# Patient Record
Sex: Male | Born: 1960 | Race: Black or African American | Hispanic: No | Marital: Married | Smoking: Never smoker
Health system: Southern US, Community
[De-identification: ages and names within clinical notes are randomized; demographics above are authoritative.]

## PROBLEM LIST (undated history)

## (undated) DIAGNOSIS — I2510 Atherosclerotic heart disease of native coronary artery without angina pectoris: Secondary | ICD-10-CM

## (undated) DIAGNOSIS — I10 Essential (primary) hypertension: Secondary | ICD-10-CM

## (undated) DIAGNOSIS — E119 Type 2 diabetes mellitus without complications: Secondary | ICD-10-CM

## (undated) DIAGNOSIS — I82890 Acute embolism and thrombosis of other specified veins: Secondary | ICD-10-CM

## (undated) DIAGNOSIS — K861 Other chronic pancreatitis: Secondary | ICD-10-CM

## (undated) DIAGNOSIS — M1990 Unspecified osteoarthritis, unspecified site: Secondary | ICD-10-CM

## (undated) HISTORY — PX: CERVICAL DISC SURGERY: SHX588

## (undated) HISTORY — DX: Acute embolism and thrombosis of other specified veins: I82.890

## (undated) HISTORY — PX: OTHER SURGICAL HISTORY: SHX169

## (undated) HISTORY — DX: Other chronic pancreatitis: K86.1

---
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
## 1997-10-22 ENCOUNTER — Encounter: Payer: Self-pay | Admitting: Emergency Medicine

## 1997-10-22 ENCOUNTER — Emergency Department (HOSPITAL_COMMUNITY): Admission: EM | Admit: 1997-10-22 | Discharge: 1997-10-22 | Payer: Self-pay | Admitting: Emergency Medicine

## 2002-11-11 ENCOUNTER — Emergency Department (HOSPITAL_COMMUNITY): Admission: EM | Admit: 2002-11-11 | Discharge: 2002-11-11 | Payer: Self-pay | Admitting: Emergency Medicine

## 2003-04-02 ENCOUNTER — Inpatient Hospital Stay (HOSPITAL_COMMUNITY): Admission: EM | Admit: 2003-04-02 | Discharge: 2003-04-05 | Payer: Self-pay | Admitting: Family Medicine

## 2003-04-05 ENCOUNTER — Encounter: Admission: RE | Admit: 2003-04-05 | Discharge: 2003-04-05 | Payer: Self-pay | Admitting: Family Medicine

## 2003-12-28 ENCOUNTER — Ambulatory Visit: Payer: Self-pay | Admitting: Internal Medicine

## 2004-01-17 ENCOUNTER — Ambulatory Visit: Payer: Self-pay | Admitting: Internal Medicine

## 2004-01-18 ENCOUNTER — Emergency Department (HOSPITAL_COMMUNITY): Admission: EM | Admit: 2004-01-18 | Discharge: 2004-01-18 | Payer: Self-pay | Admitting: Emergency Medicine

## 2004-03-14 ENCOUNTER — Ambulatory Visit: Payer: Self-pay | Admitting: Internal Medicine

## 2004-08-19 ENCOUNTER — Ambulatory Visit: Payer: Self-pay | Admitting: Internal Medicine

## 2005-06-18 ENCOUNTER — Encounter: Payer: Self-pay | Admitting: Emergency Medicine

## 2005-11-20 ENCOUNTER — Ambulatory Visit: Payer: Self-pay | Admitting: Internal Medicine

## 2005-12-22 ENCOUNTER — Ambulatory Visit: Payer: Self-pay | Admitting: Internal Medicine

## 2006-04-28 ENCOUNTER — Ambulatory Visit: Payer: Self-pay | Admitting: Internal Medicine

## 2006-05-18 ENCOUNTER — Ambulatory Visit: Payer: Self-pay | Admitting: Internal Medicine

## 2008-06-10 ENCOUNTER — Emergency Department (HOSPITAL_COMMUNITY): Admission: EM | Admit: 2008-06-10 | Discharge: 2008-06-10 | Payer: Self-pay | Admitting: Emergency Medicine

## 2008-10-02 ENCOUNTER — Encounter: Admission: RE | Admit: 2008-10-02 | Discharge: 2008-10-02 | Payer: Self-pay | Admitting: Family Medicine

## 2009-06-30 ENCOUNTER — Ambulatory Visit (HOSPITAL_COMMUNITY): Admission: RE | Admit: 2009-06-30 | Discharge: 2009-06-30 | Payer: Self-pay | Admitting: Psychiatry

## 2009-12-13 ENCOUNTER — Inpatient Hospital Stay (HOSPITAL_COMMUNITY): Admission: EM | Admit: 2009-12-13 | Discharge: 2009-12-14 | Payer: Self-pay | Admitting: Emergency Medicine

## 2009-12-13 ENCOUNTER — Emergency Department (HOSPITAL_COMMUNITY): Admission: EM | Admit: 2009-12-13 | Discharge: 2009-12-13 | Payer: Self-pay | Admitting: Family Medicine

## 2010-05-01 LAB — RAPID URINE DRUG SCREEN, HOSP PERFORMED
Barbiturates: NOT DETECTED
Opiates: POSITIVE — AB

## 2010-05-01 LAB — URINALYSIS, ROUTINE W REFLEX MICROSCOPIC
Nitrite: NEGATIVE
Protein, ur: NEGATIVE mg/dL
Specific Gravity, Urine: 1.006 (ref 1.005–1.030)
Urobilinogen, UA: 0.2 mg/dL (ref 0.0–1.0)

## 2010-05-01 LAB — COMPREHENSIVE METABOLIC PANEL
ALT: 27 U/L (ref 0–53)
ALT: 27 U/L (ref 0–53)
AST: 27 U/L (ref 0–37)
Albumin: 3.8 g/dL (ref 3.5–5.2)
Alkaline Phosphatase: 49 U/L (ref 39–117)
CO2: 20 mEq/L (ref 19–32)
CO2: 22 mEq/L (ref 19–32)
Calcium: 8.2 mg/dL — ABNORMAL LOW (ref 8.4–10.5)
Calcium: 8.4 mg/dL (ref 8.4–10.5)
Chloride: 103 mEq/L (ref 96–112)
Creatinine, Ser: 1.07 mg/dL (ref 0.4–1.5)
GFR calc Af Amer: >60 mL/min (ref >60)
GFR calc non Af Amer: >60 mL/min (ref >60)
GFR calc non Af Amer: >60 mL/min (ref >60)
Glucose, Bld: 97 mg/dL (ref 70–99)
Sodium: 131 mEq/L — ABNORMAL LOW (ref 135–145)
Sodium: 140 mEq/L (ref 135–145)
Total Bilirubin: 0.6 mg/dL (ref 0.3–1.2)

## 2010-05-01 LAB — CBC
Hemoglobin: 12.7 g/dL — ABNORMAL LOW (ref 13.0–17.0)
Hemoglobin: 13 g/dL (ref 13.0–17.0)
MCH: 31 pg (ref 26.0–34.0)
MCHC: 34.6 g/dL (ref 30.0–36.0)
MCHC: 34.6 g/dL (ref 30.0–36.0)
Platelets: 174 10*3/uL (ref 150–400)
RBC: 4.12 MIL/uL — ABNORMAL LOW (ref 4.22–5.81)

## 2010-05-01 LAB — DIFFERENTIAL
Basophils Absolute: 0 10*3/uL (ref 0.0–0.1)
Basophils Relative: 1 % (ref 0–1)
Eosinophils Absolute: 0.1 10*3/uL (ref 0.0–0.7)
Eosinophils Absolute: 0.1 10*3/uL (ref 0.0–0.7)
Eosinophils Relative: 2 % (ref 0–5)
Lymphocytes Relative: 35 % (ref 12–46)
Lymphs Abs: 1.6 10*3/uL (ref 0.7–4.0)
Lymphs Abs: 1.8 10*3/uL (ref 0.7–4.0)
Monocytes Absolute: 0.5 10*3/uL (ref 0.1–1.0)
Monocytes Relative: 10 % (ref 3–12)
Neutrophils Relative %: 55 % (ref 43–77)

## 2010-05-01 LAB — D-DIMER, QUANTITATIVE: D-Dimer, Quant: <0.22 ug/mL-FEU (ref 0.00–0.48)

## 2010-05-01 LAB — CARDIAC PANEL(CRET KIN+CKTOT+MB+TROPI)
CK, MB: 1.1 ng/mL (ref 0.3–4.0)
CK, MB: 1.3 ng/mL (ref 0.3–4.0)
Relative Index: 0.5 (ref 0.0–2.5)
Relative Index: 0.6 (ref 0.0–2.5)
Total CK: 212 U/L (ref 7–232)

## 2010-05-01 LAB — POCT CARDIAC MARKERS
CKMB, poc: <1 ng/mL — ABNORMAL LOW (ref 1.0–8.0)
Troponin i, poc: <0.05 ng/mL (ref 0.00–0.09)

## 2010-05-01 LAB — MAGNESIUM: Magnesium: 2 mg/dL (ref 1.5–2.5)

## 2010-05-01 LAB — CK TOTAL AND CKMB (NOT AT ARMC): Total CK: 241 U/L — ABNORMAL HIGH (ref 7–232)

## 2010-05-01 LAB — LIPASE, BLOOD: Lipase: 49 U/L (ref 11–59)

## 2010-05-29 LAB — DIFFERENTIAL
Eosinophils Absolute: 0 10*3/uL (ref 0.0–0.7)
Eosinophils Relative: 1 % (ref 0–5)
Lymphocytes Relative: 15 % (ref 12–46)
Lymphs Abs: 0.9 10*3/uL (ref 0.7–4.0)
Monocytes Relative: 7 % (ref 3–12)

## 2010-05-29 LAB — CBC
HCT: 41.5 % (ref 39.0–52.0)
Hemoglobin: 14.4 g/dL (ref 13.0–17.0)
MCV: 93.6 fL (ref 78.0–100.0)
Platelets: 276 10*3/uL (ref 150–400)
RBC: 4.43 MIL/uL (ref 4.22–5.81)
WBC: 6.4 10*3/uL (ref 4.0–10.5)

## 2010-05-29 LAB — BASIC METABOLIC PANEL
BUN: 9 mg/dL (ref 6–23)
Chloride: 106 mEq/L (ref 96–112)
GFR calc Af Amer: >60 mL/min (ref >60)
GFR calc non Af Amer: >60 mL/min (ref >60)
Potassium: 3.9 mEq/L (ref 3.5–5.1)
Sodium: 136 mEq/L (ref 135–145)

## 2010-05-29 LAB — SEDIMENTATION RATE: Sed Rate: 40 mm/hr — ABNORMAL HIGH (ref 0–16)

## 2010-05-29 LAB — POCT CARDIAC MARKERS
CKMB, poc: 1.5 ng/mL (ref 1.0–8.0)
Troponin i, poc: <0.05 ng/mL (ref 0.00–0.09)

## 2010-06-27 ENCOUNTER — Encounter (HOSPITAL_COMMUNITY): Payer: Self-pay | Admitting: Radiology

## 2010-06-27 ENCOUNTER — Emergency Department (HOSPITAL_COMMUNITY): Payer: BC Managed Care – PPO

## 2010-06-27 ENCOUNTER — Emergency Department (HOSPITAL_COMMUNITY)
Admission: EM | Admit: 2010-06-27 | Discharge: 2010-06-27 | Disposition: A | Discharge status: Home / Self Care | Payer: BC Managed Care – PPO | Attending: Emergency Medicine | Admitting: Emergency Medicine

## 2010-06-27 DIAGNOSIS — Z8639 Personal history of other endocrine, nutritional and metabolic disease: Secondary | ICD-10-CM | POA: Insufficient documentation

## 2010-06-27 DIAGNOSIS — R059 Cough, unspecified: Secondary | ICD-10-CM | POA: Insufficient documentation

## 2010-06-27 DIAGNOSIS — R071 Chest pain on breathing: Secondary | ICD-10-CM | POA: Insufficient documentation

## 2010-06-27 DIAGNOSIS — Z862 Personal history of diseases of the blood and blood-forming organs and certain disorders involving the immune mechanism: Secondary | ICD-10-CM | POA: Insufficient documentation

## 2010-06-27 DIAGNOSIS — R079 Chest pain, unspecified: Secondary | ICD-10-CM | POA: Insufficient documentation

## 2010-06-27 DIAGNOSIS — R05 Cough: Secondary | ICD-10-CM | POA: Insufficient documentation

## 2010-06-27 DIAGNOSIS — I10 Essential (primary) hypertension: Secondary | ICD-10-CM | POA: Insufficient documentation

## 2010-06-27 HISTORY — DX: Essential (primary) hypertension: I10

## 2010-06-27 LAB — BASIC METABOLIC PANEL
CO2: 21 mEq/L (ref 19–32)
Chloride: 100 mEq/L (ref 96–112)
GFR calc Af Amer: >60 mL/min (ref >60)
Glucose, Bld: 95 mg/dL (ref 70–99)
Sodium: 134 mEq/L — ABNORMAL LOW (ref 135–145)

## 2010-06-27 LAB — DIFFERENTIAL
Basophils Absolute: 0 10*3/uL (ref 0.0–0.1)
Basophils Relative: 0 % (ref 0–1)
Lymphocytes Relative: 21 % (ref 12–46)
Monocytes Absolute: 0.5 10*3/uL (ref 0.1–1.0)
Monocytes Relative: 10 % (ref 3–12)
Neutro Abs: 3.5 10*3/uL (ref 1.7–7.7)
Neutrophils Relative %: 66 % (ref 43–77)

## 2010-06-27 LAB — CBC
HCT: 36.3 % — ABNORMAL LOW (ref 39.0–52.0)
Hemoglobin: 12.2 g/dL — ABNORMAL LOW (ref 13.0–17.0)
MCH: 30.1 pg (ref 26.0–34.0)
RBC: 4.05 MIL/uL — ABNORMAL LOW (ref 4.22–5.81)

## 2010-06-27 LAB — POCT CARDIAC MARKERS: CKMB, poc: 1.2 ng/mL (ref 1.0–8.0)

## 2010-06-27 MED ORDER — IOHEXOL 350 MG/ML SOLN
100.0000 mL | Freq: Once | INTRAVENOUS | Status: AC | PRN
  Administered 2010-06-27: 100 mL via INTRAVENOUS

## 2010-07-05 NOTE — Discharge Summary (Signed)
NAME:  Hunter Ray, Hunter Ray                    ACCOUNT NO.:  [id number]   MEDICAL RECORD NO.:  [id number]                   PATIENT TYPE:  INP   LOCATION:  5022                                 FACILITY:  [facility]   PHYSICIAN:  Rene Paci, M.D. [facility]          DATE OF BIRTH:  15-Jun-1960   DATE OF ADMISSION:  04/02/2003  DATE OF DISCHARGE:  04/05/2003                                 DISCHARGE SUMMARY   DISCHARGE DIAGNOSES:  1. Right elbow cellulitis and bursitis.  2. Gout.   HISTORY OF PRESENT ILLNESS:  Mr. Debarr is a 50 year old African  American male who injured his right elbow at work on the Thursday prior to  admission.  He noted some swelling and pain that night.  He worked the whole  next day.  He noticed increased swelling and increased difficulty sleeping  secondary to pain.  He went to the urgent care and received an IM injection  of Rocephin and sent home.  He returned with increased swelling and had  developed a white count.  He was admitted for IV antibiotics.   PAST MEDICAL HISTORY:  1. Gout.  2. Hypertension.  3. Pancreatitis.   HOSPITAL COURSE:  INFECTIOUS DISEASE:  The patient presented with evidence  of right elevated cellulitis and bursitis.  The patient started on Unasyn.  We did ask for orthopedics to see the patient.  They recommended continuing  current treatment with additional use of high dose nonsteroidals and  treatment of gout.  They did not pursue an I&D or aspiration secondary to  potentially worsening the infection.  The patient's condition slowly  improved.  He was changed to oral antibiotics.  The patient was started on  colchicine as well.  The patient was felt to be stable for discharge with  outpatient follow-up and was discharged on April 05, 2003.   LABORATORY DATA AT DISCHARGE:  White count 8.1, hemoglobin 12.9.  LFTs were  normal.  Blood cultures were negative.   DISCHARGE MEDICATIONS:  1. Augmentin XR 1000 mg two tablets b.i.d.  for 10 days.  2. Toprol XL 100 mg daily.  3. Allopurinol 300 mg daily.  4. Colchicine p.r.n.  5. Indocin p.r.n.   FOLLOW UP:  The patient will follow up with Dr. Charlann Boxer in the next several  days and Dr. Lovell Sheehan as needed.      Cornell Barman, P.A. [facility]                  Rene Paci, M.D. [facility]    LC/MEDQ  D:  04/19/2003  T:  04/20/2003  Job:  [id number]   cc:   Stacie Glaze, M.D. [facility]   Molli Hazard D. Charlann Boxer, M.D.  [facility]  [address]  [address]  [place]  [place] [address]  Fax: [phone]

## 2010-12-05 ENCOUNTER — Ambulatory Visit: Payer: BC Managed Care – PPO | Admitting: Gastroenterology

## 2010-12-06 ENCOUNTER — Encounter: Payer: Self-pay | Admitting: Urgent Care

## 2010-12-06 ENCOUNTER — Ambulatory Visit (INDEPENDENT_AMBULATORY_CARE_PROVIDER_SITE_OTHER): Payer: BC Managed Care – PPO | Admitting: Urgent Care

## 2010-12-06 VITALS — BP 150/97 | HR 91 | Temp 98.6°F | Ht 74.0 in | Wt 222.4 lb

## 2010-12-06 DIAGNOSIS — K921 Melena: Secondary | ICD-10-CM

## 2010-12-06 DIAGNOSIS — I10 Essential (primary) hypertension: Secondary | ICD-10-CM | POA: Insufficient documentation
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
## 2010-12-06 NOTE — Assessment & Plan Note (Signed)
MIRZA FESSEL is a 50 y.o. male with recent large volume hematochezia.  He is going to need colonoscopy for further evaluation to rule out colorectal carcinoma, diverticular bleed, colon polyp, or ischemia.  Benign anorectal source remains in the differential as well.  I have discussed risks & benefits which include, but are not limited to, bleeding, infection, perforation & drug reaction.  The patient agrees with this plan & written consent will be obtained.

## 2010-12-06 NOTE — Patient Instructions (Addendum)
Your blood pressure is too high today.  Check it later today at [facility] or somewhere. Call Colette Ribas, MD if remains over 145/85. To ER if severe bleeding.  Keep colonoscopy  Rectal Bleeding Rectal bleeding is when blood passes out of the anus. It is usually a sign that something is wrong. It may not be serious, but it should always be evaluated. Rectal bleeding may present as bright red blood or extremely dark stools. The color may range from dark red or maroon to black (like tar). It is important that the cause of rectal bleeding be identified so treatment can be started and the problem corrected. CAUSES   Hemorrhoids. These are enlarged (dilated) blood vessels or veins in the anal or rectal area.   Fistulas. Theseare abnormal, burrowing channels that usually run from inside the rectum to the skin around the anus. They can bleed.   Anal fissures. This is a tear in the tissue of the anus. Bleeding occurs with bowel movements.   Diverticulosis. This is a condition in which pockets or sacs project from the bowel wall. Occasionally, the sacs can bleed.   Diverticulitis. Thisis an infection involving diverticulosis of the colon.   Proctitis and colitis. These are conditions in which the rectum, colon, or both, can become inflamed and pitted (ulcerated).   Polyps and cancer. Polyps are non-cancerous (benign) growths in the colon that may bleed. Certain types of polyps turn into cancer.   Protrusion of the rectum. Part of the rectum can project from the anus and bleed.   Certain medicines.   Intestinal infections.   Blood vessel abnormalities.  HOME CARE INSTRUCTIONS  Eat a high-fiber diet to keep your stool soft.   Limit activity.   Drink enough fluids to keep your urine clear or pale yellow.   Warm baths may be useful to soothe rectal pain.   Follow up with your caregiver as directed.  SEEK IMMEDIATE MEDICAL CARE IF:  You develop increased bleeding.   You have  black or dark red stools.   You vomit blood or material that looks like coffee grounds.   You have abdominal pain or tenderness.   You have a fever.   You feel weak, nauseous, or you faint.   You have severe rectal pain or you are unable to have a bowel movement.  MAKE SURE YOU:  Understand these instructions.   Will watch your condition.   Will get help right away if you are not doing well or get worse.  Document Released: 07/26/2001 Document Revised: 10/16/2010 Document Reviewed: 07/21/2010 [facility] Patient Information 2012 [place], [place].

## 2010-12-06 NOTE — Assessment & Plan Note (Signed)
Your blood pressure is too high today.  Check it later today at [facility] or somewhere. Call Colette Ribas, MD if remains over 145/85. To ER if severe bleeding.

## 2010-12-09 NOTE — Progress Notes (Signed)
Cc to PCP 

## 2010-12-26 ENCOUNTER — Encounter (HOSPITAL_COMMUNITY): Payer: Self-pay | Admitting: Pharmacy Technician

## 2010-12-26 MED ORDER — MEPERIDINE HCL 50 MG/ML IJ SOLN
INTRAMUSCULAR | Status: AC
  Filled 2010-12-26: qty 1

## 2010-12-26 MED ORDER — MIDAZOLAM HCL 5 MG/5ML IJ SOLN
INTRAMUSCULAR | Status: AC
  Filled 2010-12-26: qty 10

## 2011-01-01 MED ORDER — SODIUM CHLORIDE 0.45 % IV SOLN
Freq: Once | INTRAVENOUS | Status: AC
  Administered 2011-01-02: 13:00:00 via INTRAVENOUS

## 2011-01-02 ENCOUNTER — Other Ambulatory Visit: Payer: Self-pay | Admitting: Internal Medicine

## 2011-01-02 ENCOUNTER — Ambulatory Visit (HOSPITAL_COMMUNITY)
Admission: RE | Admit: 2011-01-02 | Discharge: 2011-01-03 | Disposition: A | Discharge status: Home / Self Care | Payer: BC Managed Care – PPO | Source: Ambulatory Visit | Attending: Internal Medicine | Admitting: Internal Medicine

## 2011-01-02 ENCOUNTER — Encounter (HOSPITAL_COMMUNITY)
Admission: RE | Disposition: A | Discharge status: Home / Self Care | Payer: Self-pay | Source: Ambulatory Visit | Attending: Internal Medicine

## 2011-01-02 ENCOUNTER — Encounter (HOSPITAL_COMMUNITY): Payer: Self-pay

## 2011-01-02 DIAGNOSIS — D126 Benign neoplasm of colon, unspecified: Secondary | ICD-10-CM | POA: Insufficient documentation

## 2011-01-02 DIAGNOSIS — I10 Essential (primary) hypertension: Secondary | ICD-10-CM | POA: Insufficient documentation

## 2011-01-02 DIAGNOSIS — Z79899 Other long term (current) drug therapy: Secondary | ICD-10-CM | POA: Insufficient documentation

## 2011-01-02 DIAGNOSIS — K921 Melena: Secondary | ICD-10-CM

## 2011-01-02 HISTORY — PX: COLONOSCOPY: SHX5424

## 2011-01-02 SURGERY — COLONOSCOPY
Anesthesia: Moderate Sedation

## 2011-01-02 MED ORDER — MIDAZOLAM HCL 5 MG/5ML IJ SOLN
INTRAMUSCULAR | Status: DC | PRN
  Administered 2011-01-02: 2 mg via INTRAVENOUS
  Administered 2011-01-02: 1 mg via INTRAVENOUS
  Administered 2011-01-02 (×2): 2 mg via INTRAVENOUS

## 2011-01-02 MED ORDER — MEPERIDINE HCL 100 MG/ML IJ SOLN
INTRAMUSCULAR | Status: AC
  Filled 2011-01-02: qty 2

## 2011-01-02 MED ORDER — MEPERIDINE HCL 100 MG/ML IJ SOLN
INTRAMUSCULAR | Status: DC | PRN
  Administered 2011-01-02: 25 mg via INTRAVENOUS
  Administered 2011-01-02 (×2): 50 mg via INTRAVENOUS

## 2011-01-02 MED ORDER — MIDAZOLAM HCL 5 MG/5ML IJ SOLN
INTRAMUSCULAR | Status: AC
  Filled 2011-01-02: qty 10

## 2011-01-02 NOTE — H&P (Signed)
  I have seen & examined the patient prior to the procedure(s) today and reviewed the history and physical/consultation.  There have been no changes.  After consideration of the risks, benefits, alternatives and imponderables, the patient has consented to the procedure(s).   

## 2011-01-13 ENCOUNTER — Encounter (HOSPITAL_COMMUNITY): Payer: Self-pay | Admitting: Internal Medicine

## 2011-01-16 ENCOUNTER — Encounter: Payer: Self-pay | Admitting: Internal Medicine

## 2011-03-02 ENCOUNTER — Emergency Department (HOSPITAL_COMMUNITY): Payer: BC Managed Care – PPO

## 2011-03-02 ENCOUNTER — Other Ambulatory Visit: Payer: Self-pay

## 2011-03-02 ENCOUNTER — Inpatient Hospital Stay (HOSPITAL_COMMUNITY)
Admission: EM | Admit: 2011-03-02 | Discharge: 2011-03-05 | DRG: 557 | Disposition: A | Discharge status: Home / Self Care | Payer: BC Managed Care – PPO | Attending: Internal Medicine | Admitting: Internal Medicine

## 2011-03-02 ENCOUNTER — Encounter (HOSPITAL_COMMUNITY): Payer: Self-pay | Admitting: Adult Health

## 2011-03-02 DIAGNOSIS — K859 Acute pancreatitis without necrosis or infection, unspecified: Principal | ICD-10-CM | POA: Diagnosis present

## 2011-03-02 DIAGNOSIS — K862 Cyst of pancreas: Secondary | ICD-10-CM | POA: Diagnosis present

## 2011-03-02 DIAGNOSIS — F1010 Alcohol abuse, uncomplicated: Secondary | ICD-10-CM | POA: Diagnosis present

## 2011-03-02 DIAGNOSIS — R634 Abnormal weight loss: Secondary | ICD-10-CM | POA: Diagnosis present

## 2011-03-02 DIAGNOSIS — K861 Other chronic pancreatitis: Secondary | ICD-10-CM | POA: Diagnosis present

## 2011-03-02 DIAGNOSIS — D7389 Other diseases of spleen: Secondary | ICD-10-CM | POA: Diagnosis present

## 2011-03-02 DIAGNOSIS — I10 Essential (primary) hypertension: Secondary | ICD-10-CM | POA: Diagnosis present

## 2011-03-02 DIAGNOSIS — M1A0420 Idiopathic chronic gout, left hand, without tophus (tophi): Secondary | ICD-10-CM

## 2011-03-02 DIAGNOSIS — M109 Gout, unspecified: Secondary | ICD-10-CM | POA: Diagnosis present

## 2011-03-02 LAB — COMPREHENSIVE METABOLIC PANEL
Albumin: 3.8 g/dL (ref 3.5–5.2)
Alkaline Phosphatase: 73 U/L (ref 39–117)
BUN: 10 mg/dL (ref 6–23)
Potassium: 4.2 mEq/L (ref 3.5–5.1)
Sodium: 135 mEq/L (ref 135–145)
Total Protein: 8.2 g/dL (ref 6.0–8.3)

## 2011-03-02 LAB — DIFFERENTIAL
Basophils Absolute: 0 10*3/uL (ref 0.0–0.1)
Basophils Relative: 0 % (ref 0–1)
Eosinophils Absolute: 0.1 10*3/uL (ref 0.0–0.7)
Monocytes Relative: 12 % (ref 3–12)
Neutrophils Relative %: 73 % (ref 43–77)

## 2011-03-02 LAB — CBC
MCH: 29.8 pg (ref 26.0–34.0)
MCHC: 34 g/dL (ref 30.0–36.0)
Platelets: 193 10*3/uL (ref 150–400)
RDW: 13.2 % (ref 11.5–15.5)

## 2011-03-02 LAB — URINE MICROSCOPIC-ADD ON

## 2011-03-02 LAB — URINALYSIS, ROUTINE W REFLEX MICROSCOPIC
Glucose, UA: NEGATIVE mg/dL
Ketones, ur: 40 mg/dL — AB
Protein, ur: NEGATIVE mg/dL

## 2011-03-02 LAB — URINE CULTURE
Colony Count: 5000
Culture  Setup Time: 201301140228

## 2011-03-02 LAB — LIPASE, BLOOD: Lipase: 118 U/L — ABNORMAL HIGH (ref 11–59)

## 2011-03-02 MED ORDER — FAMOTIDINE IN NACL 20-0.9 MG/50ML-% IV SOLN
20.0000 mg | Freq: Once | INTRAVENOUS | Status: AC
  Administered 2011-03-02: 20 mg via INTRAVENOUS
  Filled 2011-03-02: qty 50

## 2011-03-02 MED ORDER — IOHEXOL 300 MG/ML  SOLN
100.0000 mL | Freq: Once | INTRAMUSCULAR | Status: AC | PRN
  Administered 2011-03-02: 100 mL via INTRAVENOUS

## 2011-03-02 MED ORDER — MORPHINE SULFATE 4 MG/ML IJ SOLN
4.0000 mg | INTRAMUSCULAR | Status: AC | PRN
  Administered 2011-03-02 (×2): 4 mg via INTRAVENOUS
  Filled 2011-03-02 (×2): qty 1

## 2011-03-02 MED ORDER — SODIUM CHLORIDE 0.9 % IV SOLN
INTRAVENOUS | Status: DC
  Administered 2011-03-02 – 2011-03-04 (×3): via INTRAVENOUS

## 2011-03-02 MED ORDER — ONDANSETRON HCL 4 MG/2ML IJ SOLN
4.0000 mg | INTRAMUSCULAR | Status: DC | PRN
  Administered 2011-03-02: 4 mg via INTRAVENOUS
  Filled 2011-03-02: qty 2

## 2011-03-02 NOTE — ED Notes (Signed)
CM completed.Patient transported to CT

## 2011-03-02 NOTE — ED Notes (Signed)
Pt reports sharp pains across the top of his abdomen for a week that comes and goes with radiation to left shoulder and left arm. Pt reports episodes come and go lasting for a few seconds or for a few hours. Pt reports episode of vomiting yesterday and pt reports nausea with pain episodes. No issues with diarrhea or constipation. No fevers. Pt denies previous issues like this.

## 2011-03-02 NOTE — ED Notes (Signed)
MD at bedside. 
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
## 2011-03-02 NOTE — ED Notes (Signed)
Reports upper abdominal pain that radiates across upper abdomen from side to side for a week and a half. Today began having left sided neck pain that radiates down into left arm. Left hand swollen. Pt denies SOB, c/o nausea.

## 2011-03-03 ENCOUNTER — Encounter (HOSPITAL_COMMUNITY): Payer: Self-pay

## 2011-03-03 ENCOUNTER — Emergency Department (HOSPITAL_COMMUNITY): Payer: BC Managed Care – PPO

## 2011-03-03 DIAGNOSIS — K861 Other chronic pancreatitis: Secondary | ICD-10-CM

## 2011-03-03 DIAGNOSIS — K859 Acute pancreatitis without necrosis or infection, unspecified: Secondary | ICD-10-CM

## 2011-03-03 DIAGNOSIS — M109 Gout, unspecified: Secondary | ICD-10-CM

## 2011-03-03 LAB — BASIC METABOLIC PANEL
CO2: 22 mEq/L (ref 19–32)
Calcium: 9.7 mg/dL (ref 8.4–10.5)
GFR calc non Af Amer: >90 mL/min (ref >90)
Glucose, Bld: 122 mg/dL — ABNORMAL HIGH (ref 70–99)
Potassium: 4 mEq/L (ref 3.5–5.1)
Sodium: 135 mEq/L (ref 135–145)

## 2011-03-03 LAB — CBC
Hemoglobin: 13 g/dL (ref 13.0–17.0)
MCH: 29.5 pg (ref 26.0–34.0)
MCV: 87.8 fL (ref 78.0–100.0)
Platelets: 185 10*3/uL (ref 150–400)
RBC: 4.41 MIL/uL (ref 4.22–5.81)
WBC: 7.4 10*3/uL (ref 4.0–10.5)

## 2011-03-03 MED ORDER — MORPHINE SULFATE 2 MG/ML IJ SOLN
2.0000 mg | Freq: Once | INTRAMUSCULAR | Status: AC
  Administered 2011-03-03: 2 mg via INTRAVENOUS
  Filled 2011-03-03: qty 1

## 2011-03-03 MED ORDER — HYDRALAZINE HCL 20 MG/ML IJ SOLN
10.0000 mg | Freq: Four times a day (QID) | INTRAMUSCULAR | Status: DC | PRN
  Filled 2011-03-03: qty 0.5

## 2011-03-03 MED ORDER — ONDANSETRON HCL 4 MG/2ML IJ SOLN: 4.0000 mg | Freq: Four times a day (QID) | INTRAMUSCULAR | Status: DC | PRN

## 2011-03-03 MED ORDER — PROSIGHT PO TABS
1.0000 | ORAL_TABLET | Freq: Every day | ORAL | Status: DC
  Administered 2011-03-03 – 2011-03-05 (×3): 1 via ORAL
  Filled 2011-03-03 (×3): qty 1

## 2011-03-03 MED ORDER — OLMESARTAN MEDOXOMIL 40 MG PO TABS
40.0000 mg | ORAL_TABLET | Freq: Every day | ORAL | Status: DC
  Administered 2011-03-04 – 2011-03-05 (×2): 40 mg via ORAL
  Filled 2011-03-03 (×2): qty 1

## 2011-03-03 MED ORDER — ENOXAPARIN SODIUM 40 MG/0.4ML ~~LOC~~ SOLN
40.0000 mg | SUBCUTANEOUS | Status: DC
  Administered 2011-03-03 – 2011-03-05 (×3): 40 mg via SUBCUTANEOUS
  Filled 2011-03-03 (×3): qty 0.4

## 2011-03-03 MED ORDER — THIAMINE HCL 100 MG/ML IJ SOLN
100.0000 mg | Freq: Every day | INTRAMUSCULAR | Status: DC
  Administered 2011-03-03 – 2011-03-04 (×2): 100 mg via INTRAVENOUS
  Filled 2011-03-03 (×3): qty 2

## 2011-03-03 MED ORDER — OLMESARTAN MEDOXOMIL 40 MG PO TABS
40.0000 mg | ORAL_TABLET | ORAL | Status: AC
  Administered 2011-03-03: 40 mg via ORAL
  Filled 2011-03-03: qty 1

## 2011-03-03 MED ORDER — PANTOPRAZOLE SODIUM 40 MG PO TBEC
40.0000 mg | DELAYED_RELEASE_TABLET | Freq: Every day | ORAL | Status: DC
  Administered 2011-03-03 – 2011-03-05 (×3): 40 mg via ORAL
  Filled 2011-03-03 (×3): qty 1

## 2011-03-03 MED ORDER — ONDANSETRON HCL 4 MG PO TABS: 4.0000 mg | ORAL_TABLET | Freq: Four times a day (QID) | ORAL | Status: DC | PRN

## 2011-03-03 MED ORDER — LABETALOL HCL 5 MG/ML IV SOLN
10.0000 mg | INTRAVENOUS | Status: DC | PRN
  Administered 2011-03-03: 10 mg via INTRAVENOUS
  Filled 2011-03-03: qty 4

## 2011-03-03 MED ORDER — DEXTROSE-NACL 5-0.45 % IV SOLN
INTRAVENOUS | Status: DC
  Administered 2011-03-03: 07:00:00 via INTRAVENOUS

## 2011-03-03 MED ORDER — METHYLPREDNISOLONE SODIUM SUCC 125 MG IJ SOLR
80.0000 mg | Freq: Once | INTRAMUSCULAR | Status: AC
  Administered 2011-03-03: 80 mg via INTRAVENOUS
  Filled 2011-03-03: qty 2

## 2011-03-03 MED ORDER — MORPHINE SULFATE 2 MG/ML IJ SOLN
4.0000 mg | INTRAMUSCULAR | Status: DC | PRN
  Administered 2011-03-03 – 2011-03-04 (×3): 4 mg via INTRAVENOUS
  Filled 2011-03-03 (×3): qty 2

## 2011-03-03 MED ORDER — PREDNISONE 20 MG PO TABS
40.0000 mg | ORAL_TABLET | Freq: Every day | ORAL | Status: DC
  Administered 2011-03-04 – 2011-03-05 (×2): 40 mg via ORAL
  Filled 2011-03-03 (×3): qty 2

## 2011-03-03 MED ORDER — ACETAMINOPHEN 325 MG PO TABS: 650.0000 mg | ORAL_TABLET | Freq: Four times a day (QID) | ORAL | Status: DC | PRN

## 2011-03-03 MED ORDER — ACETAMINOPHEN 650 MG RE SUPP: 650.0000 mg | Freq: Four times a day (QID) | RECTAL | Status: DC | PRN

## 2011-03-03 MED ORDER — HYDRALAZINE HCL 20 MG/ML IJ SOLN
10.0000 mg | Freq: Once | INTRAMUSCULAR | Status: AC
  Administered 2011-03-03: 10 mg via INTRAVENOUS
  Filled 2011-03-03 (×2): qty 0.5

## 2011-03-03 MED ORDER — GADOBENATE DIMEGLUMINE 529 MG/ML IV SOLN
20.0000 mL | Freq: Once | INTRAVENOUS | Status: AC | PRN
  Administered 2011-03-03: 20 mL via INTRAVENOUS

## 2011-03-03 NOTE — Progress Notes (Addendum)
PATIENT DETAILS Name: Hunter Ray Age: 51 y.o. Sex: male Date of Birth: 05-28-1960 Admit Date: 03/02/2011 WUJ:[id number],WGNF CABOT, MD, MD  Subjective: Is doing better, less pain. Further history-former alcoholic, looks like he has a relapse as he claims to have started drinking heavily for one week.  Objective: Vital signs in last 24 hours: Filed Vitals:   03/03/11 0600 03/03/11 0615 03/03/11 0720 03/03/11 0838  BP: 141/91 150/96 139/89 149/94  Pulse: 89 88 85 92  Temp:   98.7 F (37.1 C) 98 F (36.7 C)  TempSrc:   Oral Oral  Resp: 14 18 20    SpO2: 96% 97% 99% 99%    Weight change:   There is no height or weight on file to calculate BMI.  Intake/Output from previous day: No intake or output data in the 24 hours ending 03/03/11 1028  PHYSICAL EXAM: Gen Exam: Awake and alert with clear speech.   Neck: Supple, No JVD.   Chest: B/L Clear.   CVS: S1 S2 Regular, no murmurs.  Abdomen: soft, BS +,non distended, mildly tender in the epigastric area without any rebound or rigidity. Extremities: no edema, lower extremities warm to touch. Right ankle only minimally swollen without any erythema, mildly tender Neurologic: Non Focal.   Skin: No Rash.  Wounds: N/A.    CONSULTS:  [facility]-awaiting evaluation. LAB RESULTS: CBC  Lab 03/03/11 0520 03/02/11 2020  WBC 7.4 7.7  HGB 13.0 13.4  HCT 38.7* 39.4  PLT 185 193  MCV 87.8 87.8  MCH 29.5 29.8  MCHC 33.6 34.0  RDW 13.1 13.2  LYMPHSABS -- 1.0  MONOABS -- 0.9  EOSABS -- 0.1  BASOSABS -- 0.0  BANDABS -- --    Chemistries   Lab 03/03/11 0520 03/02/11 2020  NA 135 135  K 4.0 4.2  CL 101 98  CO2 22 23  GLUCOSE 122* 95  BUN 7 10  CREATININE 0.86 0.86  CALCIUM 9.7 9.9  MG -- --    GFR The CrCl is unknown because both a height and weight (above a minimum accepted value) are required for this calculation.  Coagulation profile No results found for this basename: INR:5,PROTIME:5 in the last 168  hours  Cardiac Enzymes No results found for this basename: CK:3,CKMB:3,TROPONINI:3,MYOGLOBIN:3 in the last 168 hours  No components found with this basename: POCBNP:3 No results found for this basename: DDIMER:2 in the last 72 hours No results found for this basename: HGBA1C:2 in the last 72 hours No results found for this basename: CHOL:2,HDL:2,LDLCALC:2,TRIG:2,CHOLHDL:2,LDLDIRECT:2 in the last 72 hours No results found for this basename: TSH,T4TOTAL,FREET3,T3FREE,THYROIDAB in the last 72 hours No results found for this basename: VITAMINB12:2,FOLATE:2,FERRITIN:2,TIBC:2,IRON:2,RETICCTPCT:2 in the last 72 hours  Basename 03/02/11 2020  LIPASE 118*  AMYLASE --    Urine Studies No results found for this basename: UACOL:2,UAPR:2,USPG:2,UPH:2,UTP:2,UGL:2,UKET:2,UBIL:2,UHGB:2,UNIT:2,UROB:2,ULEU:2,UEPI:2,UWBC:2,URBC:2,UBAC:2,CAST:2,CRYS:2,UCOM:2,BILUA:2 in the last 72 hours  MICROBIOLOGY: No results found for this or any previous visit (from the past 240 hour(s)).  RADIOLOGY STUDIES/RESULTS: Dg Wrist Complete Left  03/02/2011  *RADIOLOGY REPORT*  Clinical Data: Pain, swelling, gout arthritis  LEFT WRIST - COMPLETE 3+ VIEW  Comparison: 10/02/2008  Findings: Normal alignment.  No fracture.  Intact distal radius, ulna and carpal bones.  Slight soft tissue swelling.  IMPRESSION: No acute osseous finding.  Original Report Authenticated By: Judie Petit. Ruel Favors, M.D.   Ct Abdomen Pelvis W Contrast  03/02/2011  *RADIOLOGY REPORT*  Clinical Data: Right-sided abdominal pain, nausea and vomiting.  CT ABDOMEN AND PELVIS WITH CONTRAST  Technique:  Multidetector CT  imaging of the abdomen and pelvis was performed following the standard protocol during bolus administration of intravenous contrast.  Contrast: OMNIPAQUE IOHEXOL 300 MG/ML IV SOLN CT of the abdomen and pelvis performed 01/18/2004  Comparison: CT of the abdomen and pelvis performed 01/18/2004  Findings: Minimal bibasilar atelectasis is noted.   There is mild diffuse soft tissue inflammation about the pancreas, raising concern for pancreatitis.  Diffuse calcifications are noted within the pancreas, likely reflecting sequelae of chronic pancreatitis.  There is a poorly characterized 2.4 cm hypodensity along the pancreatic body; though this could reflect a poorly characterized pseudocyst, a mass cannot be excluded given its appearance.  In addition, there is prominent soft tissue density tracking along the antrum of the stomach, with a dominant portion measuring 3.4 cm.  This could reflect sequelae of prior severe pancreatitis with chronic associated phlegmon, though as described above, malignancy cannot be excluded.  Scattered prominent peripancreatic nodes are also seen.  These measure up to 1.0 cm in short axis.  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  The liver and spleen are unremarkable in appearance.  The gallbladder is within normal limits.  The adrenal glands are unremarkable.  Minimal nonspecific right-sided perinephric stranding is noted. The kidneys are otherwise unremarkable in appearance.  There is no evidence of hydronephrosis.  No renal or ureteral stones are seen.  No free fluid is identified.  The small bowel is unremarkable in appearance.  The stomach is within normal limits.  No acute vascular abnormalities are seen.  The appendix is normal in caliber, without evidence for appendicitis.  The colon is largely filled with contrast and is grossly unremarkable appearance.  The bladder is mildly distended and grossly unremarkable in appearance.  The prostate remains normal in size.  Scattered mildly enlarged right inguinal nodes are noted, measuring up to 1.1 cm in short axis.  No acute osseous abnormalities are identified.  Degenerative subcortical cystic change is noted at both femoral heads, mildly worse on the right.  IMPRESSION:  1.  Mild soft tissue inflammation about the  pancreas raises concern for mild pancreatitis.  Underlying changes of chronic pancreatitis noted. 2.  Poorly characterized 2.4 cm hypodensity along the pancreatic body.  Though this could reflect a poorly characterized pseudocyst, a mass cannot be excluded.  MRCP could be considered for further evaluation, as deemed clinically appropriate. 3.  Prominent soft tissue density tracking along the antrum of the stomach, with a dominant portion measuring 3.4 cm.  This could reflect sequelae of prior severe pancreatitis with chronic phlegmon, though as described above, malignancy cannot be excluded. 4.  Prominent peripancreatic nodes noted, measuring up to 1.0 cm in short axis. 5.  Chronic occlusion of the splenic vein, with prominent gastric and splenic varices. 6.  Mildly enlarged right inguinal nodes, measuring up to 1.1 cm in short axis. 7.  Degenerative subcortical cystic change at both femoral heads, mildly worse on the right.  Original Report Authenticated By: Tonia Ghent, M.D.   Mr 3d Recon At Scanner  03/03/2011  *RADIOLOGY REPORT*  Clinical Data:  Chronic pancreatitis.  Abdominal pain and vomiting. Indeterminate pancreatic lesions seen on recent CT.  MRI ABDOMEN WITHOUT AND WITH CONTRAST (MRCP)  Technique:  Multiplanar multisequence MR imaging of the abdomen was performed without and with contrast, including heavily T2-weighted images of the biliary and pancreatic ducts.  Three-dimensional MR images were rendered by post processing of the original MR data.  Contrast:  20mL MULTIHANCE GADOBENATE DIMEGLUMINE 529 MG/ML IV SOLN  Comparison:  CT on 03/02/2011  Findings:  Pancreatic calcifications are better shown on recent CT. There is a mild pancreatic swelling and peripancreatic inflammatory changes involving the body and tail, consistent with acute on chronic pancreatitis.  There is a thin rim enhancing cystic lesion is seen along the posterior aspect of the pancreatic tail which measures 1.7 x 3.1 cm and shows  communication with the adjacent main pancreatic duct.  This is consistent with a small pancreatic pseudocyst.  In addition, there is a small rim enhancing fluid collection along the posterior serosal surface of the gastric antrum which measures 1.2 x 2.5 cm. This is also consistent with a small pseudocyst.  No other peripancreatic or intra abdominal fluid collections are identified.  MRCP shows no evidence of biliary dilatation, stricture, or choledocholithiasis.  The common bile duct measures 4-5 mm in maximum diameter.  Gallbladder is unremarkable.  The pancreatic duct shows mild dilatation and " beaded appearance", particularly in the pancreatic body and tail, consistent with chronic pancreatitis.  Chronic splenic vein thrombosis again demonstrated, with numerous varices in the gastrosplenic and gastroc hepatic ligaments. No evidence of portal vein thrombosis.  Spleen is at the upper limits of normal in size, measuring approximately 13 cm in length. No evidence of ascites.  No liver or splenic masses are identified.  The adrenal glands and kidneys are normal appearance.  No evidence of hydronephrosis.  No evidence of soft tissue masses or lymphadenopathy.  IMPRESSION:  1.  Mild acute on chronic pancreatitis.  Two small rim enhancing cystic lesions along the posterior aspects of the pancreatic tail and gastric antrum, consistent with pancreatic pseudocysts. 2.  No evidence of biliary dilatation or choledocholithiasis. Changes of chronic pancreatitis involving the pancreatic duct.  3.  Chronic splenic vein thrombosis, with gastrosplenic and gastrohepatic varices.  Borderline splenomegaly.  Original Report Authenticated By: Danae Orleans, M.D.   Dg Hand Complete Left  03/02/2011  *RADIOLOGY REPORT*  Clinical Data: Pain, swelling, gout arthritis  LEFT HAND - COMPLETE 3+ VIEW  Comparison: 10/04/2008  Findings: Fingers are flexed in  position.  Soft tissue swelling over the MCP joints and the PIP joints.  No  fracture, subluxation or dislocation.  No radiographic foreign body.  Question small erosion of the third proximal phalanx at the PIP joint along the radial side.  IMPRESSION: Periarticular soft tissue swelling at the MCP and PIP joints.  No acute osseous finding  Possible small erosion of the left third proximal phalanx at the PIP joint.  Original Report Authenticated By: Judie Petit. Ruel Favors, M.D.   Mr Abd W/wo Cm/mrcp  03/03/2011  *RADIOLOGY REPORT*  Clinical Data:  Chronic pancreatitis.  Abdominal pain and vomiting. Indeterminate pancreatic lesions seen on recent CT.  MRI ABDOMEN WITHOUT AND WITH CONTRAST (MRCP)  Technique:  Multiplanar multisequence MR imaging of the abdomen was performed without and with contrast, including heavily T2-weighted images of the biliary and pancreatic ducts.  Three-dimensional MR images were rendered by post processing of the original MR data.  Contrast: 20mL MULTIHANCE GADOBENATE DIMEGLUMINE 529 MG/ML IV SOLN  Comparison:  CT on 03/02/2011  Findings:  Pancreatic calcifications are better shown on recent CT. There is a mild pancreatic swelling and peripancreatic inflammatory changes involving the body and tail, consistent with acute on chronic pancreatitis.  There is a thin rim enhancing cystic lesion is seen along the posterior aspect of the pancreatic tail which measures 1.7 x 3.1 cm and shows  communication with the adjacent main pancreatic duct.  This is consistent with a small pancreatic pseudocyst.  In addition, there is a small rim enhancing fluid collection along the posterior serosal surface of the gastric antrum which measures 1.2 x 2.5 cm. This is also consistent with a small pseudocyst.  No other peripancreatic or intra abdominal fluid collections are identified.  MRCP shows no evidence of biliary dilatation, stricture, or choledocholithiasis.  The common bile duct measures 4-5 mm in maximum diameter.  Gallbladder is unremarkable.  The pancreatic duct shows mild dilatation  and " beaded appearance", particularly in the pancreatic body and tail, consistent with chronic pancreatitis.  Chronic splenic vein thrombosis again demonstrated, with numerous varices in the gastrosplenic and gastroc hepatic ligaments. No evidence of portal vein thrombosis.  Spleen is at the upper limits of normal in size, measuring approximately 13 cm in length. No evidence of ascites.  No liver or splenic masses are identified.  The adrenal glands and kidneys are normal appearance.  No evidence of hydronephrosis.  No evidence of soft tissue masses or lymphadenopathy.  IMPRESSION:  1.  Mild acute on chronic pancreatitis.  Two small rim enhancing cystic lesions along the posterior aspects of the pancreatic tail and gastric antrum, consistent with pancreatic pseudocysts. 2.  No evidence of biliary dilatation or choledocholithiasis. Changes of chronic pancreatitis involving the pancreatic duct.  3.  Chronic splenic vein thrombosis, with gastrosplenic and gastrohepatic varices.  Borderline splenomegaly.  Original Report Authenticated By: Danae Orleans, M.D.    MEDICATIONS: Scheduled Meds:   . enoxaparin  40 mg Subcutaneous Q24H  . famotidine  20 mg Intravenous Once  . hydrALAZINE  10 mg Intravenous Once  . methylPREDNISolone (SOLU-MEDROL) injection  80 mg Intravenous Once  . olmesartan  40 mg Oral NOW  . olmesartan  40 mg Oral Daily  . pantoprazole  40 mg Oral Q1200  . predniSONE  40 mg Oral Q breakfast   Continuous Infusions:   . sodium chloride Stopped (03/03/11 0640)  . dextrose 5 % and 0.45% NaCl 100 mL/hr at 03/03/11 0828   PRN Meds:.acetaminophen, acetaminophen, gadobenate dimeglumine, hydrALAZINE, iohexol, labetalol, morphine, morphine injection, ondansetron (ZOFRAN) IV, ondansetron, DISCONTD: ondansetron  Antibiotics: Anti-infectives    None      Assessment/Plan: Patient Active Hospital Problem List: Pancreatitis, acute on chronic    Assessment: Likely secondary to EtOH  use/relapse    Plan: Advance diet to full liquids, continue with supportive care. Given CT findings of phlegmon and splenic vein thrombosis, will consult gastroenterology.   Hypertension    Assessment: Better controlled this morning    Plan: Continue with Benicar.   Chronic pancreatitis    Assessment: Claims he had a prior episode of pancreatitis 8 years ago, apparently per history obtained today this was also alcohol related. Looks like his CT findings are more of a chronic issue.    Plan: Await gastroenterology now for further recommendations if any.   Gout attack    Assessment: This is involving his right ankle, patient claims that this is now better, he claims that he always gets gout at this particular site, apparently yesterday he felt as if he was having a gouty flare-as symptoms were similar in the past as well.    Plan: Has received a dose of Solu-Medrol here in the emergency room with good results, I will transition him to prednisone for a short course.  EtOH use Assessment: Unfortunately the looks like this patient has had a relapse, his last drink  was yesterday, he currently has no signs of withdrawal. Plan: Social worker, begin MVI and thiamine. Watch for any signs of withdrawal.   Disposition: Remain inpatient, advanced to full liquid diet today.  DVT Prophylaxis: Lovenox.  Code Status: Full code.  Dewayne Shorter M Cherelle Midkiff,MD. 03/03/2011, 10:28 AM

## 2011-03-03 NOTE — ED Notes (Signed)
Pt from MRI. Pt cont resting on stretcher and awaiting bed assignment. Pt with no needs or concerns at this time. Will cont to monitor

## 2011-03-03 NOTE — ED Notes (Signed)
Pt to MRI

## 2011-03-03 NOTE — ED Notes (Signed)
Called to give report nurse unavailable will call back.  

## 2011-03-03 NOTE — H&P (Signed)
Primary Care Physician: Assunta Found MD [id number])  Chief Complaint: Abdominal pain for one and a half weeks  History of Present Illness: Patient is a 52 year old African American gentleman history of hypertension, gout, a single bouts of pancreatitis in the remote past who presents for evaluation of abdominal pain for one and half weeks. Patient reports that he was in his usual state of health when he started to experience a mid gastric pain/discomfort that progressively got worse. Reports associated decreased appetite and inability to tolerate by mouth. Had a bout of chills and nonbilious, nonbloody emesis yesterday. No recent sick contacts. Reports baseline drinking approximately two 6 packs of beers a week, which has not changed recently. As far as he knows, he has not had a bout of pancreatitis since that single episode in the remote past which he reports as severe, needing to be admitted for several days. Does also report a 14 pound weight loss since past November, unintentional, with drenching night sweats that have gotten worse over the past couple weeks. Strong family history of cancer, although none are reportedly GI in etiology. Also has not been able to keep down his blood pressure medication for the past couple days. Last colonoscopy was back in November for a bout of hematochezia, which demonstrated a small polyp that was excised but was otherwise within normal limits. Has never had an endoscopy.  In addition, the patient complains of worsening of left hand pain that is radiating up the arm. Has been dealing with for the past 3 weeks, having significantly improved since the onset. The patient believes that the pain and swelling is consistent with his previous episodes of gout, although much worse than before. Reports having not tried anything for the flare. Also reports significant dehydration with the drinking that he has been doing.  In the emergency room, initial vitals temperature 99.1,  blood pressure 176/116, heart rate 118, respirations 16, satting 97% on room air. Initial labs significant for a lipase of 118. Initial troponin EKG negative for ischemic changes. CT abdomen pelvis demonstrating acute on chronic pancreatitis with ill-defined soft tissue densities which may represent pseudocysts, as well as splenic vein thrombosis and multiple varices. In addition, there is a prominent soft tissue lesion tracking along the antrum of the stomach, indeterminate as to whether this may represent phlegmonous changes, pseudocyst, or a malignancy. Radiographs of the wrists and hands demonstrating erosions and soft tissue changes. The patient was given morphine 4 mg IV x1, Zofran and Pepcid x 1. The patient was admitted for further evaluation and management.  Past Medical/Surgical History: Hypertension Gout Single bout of pancreatitis, as described above Recent episode of hematochezia in 11/2010   - Colonoscopy demonstrating a small polyp  Allergies: No known drug allergies  Medications: Benicar 40 mg by mouth daily Colchicine and prednisone 10 mg as needed for gout  Family History: Reports strong family history of malignancy, none of which are GI related  Social History: Married, lives with wife, Social research officer, government at [employer] Denies any tobacco or illicits Alcohol as above  Review of Systems: General: As per history of present illness Skin: No rashes or lacerations HEENT: No rhinorrhea, sore throat, dry mouth, hearing difficulties Pulmonary: No cough, wheezing, shortness of breath Cardivascular: No chest pain, dyspnea on exertion, palpitations, lightheaded/dizziness, paroxysmal nocturnal dyspnea, orthopnea Gastrointestinal: As per history of present illness Genitourinary: No dysuria, hematuria, increased urinary frequency/urgency. No discharge Musculoskeletal: As per HPI Hematologic: No easy bruising or bleeding Neurologic: No headaches, vision changes, focal  neurologic  deficits Psychologic: No suicidial or homicidal ideation. No depression  Filed Vitals:   03/02/11 2033 03/02/11 2200 03/02/11 2230 03/02/11 2300  BP: 192/117 185/106 179/115 179/112  Pulse: 100 96 101 110  Temp: 98.6 F (37 C)     TempSrc: Oral     Resp: 20     SpO2: 99% 98% 97% 97%    Physical Exam: General: Alert and oriented x 3, visibly diaphoretic Skin: No rashes, bruises HEENT: Head atraumatic, sclera anicertic, pupils equal and reactive to light, oropharynx moist with tonsils unremarkable Neck: Soft, no lymphadenopathy, thyromegaly, or bruits Chest: Clear to auscultation bilaterally, no wheezes, rales, or ronchi Heart: Regular rate and rhythm, normal S1/S2 no rubs, gallops, or murmurs Abdomen: Soft, nondistended, diffusely tender to palpation with no peritoneal signs. Positive bowel sounds, no gross masses identified. Extremities: No cyanosis or clubbing. Erythema and left hand swelling seen about the proximal interphalangeal joints and at the wrist, extremely tender to attempted movement. Right ankle swollen when compared to the left, consistent with known gout flare per patient. 2+ radial and dorsalis pedis pulses bilaterally Neurologic: Grossly intact   Labs: CBC    Component Value Date/Time   WBC 7.7 03/02/2011 2020   RBC 4.49 03/02/2011 2020   HGB 13.4 03/02/2011 2020   HCT 39.4 03/02/2011 2020   PLT 193 03/02/2011 2020   MCV 87.8 03/02/2011 2020   MCH 29.8 03/02/2011 2020   MCHC 34.0 03/02/2011 2020   RDW 13.2 03/02/2011 2020   LYMPHSABS 1.0 03/02/2011 2020   MONOABS 0.9 03/02/2011 2020   EOSABS 0.1 03/02/2011 2020   BASOSABS 0.0 03/02/2011 2020    BMET    Component Value Date/Time   NA 135 03/02/2011 2020   K 4.2 03/02/2011 2020   CL 98 03/02/2011 2020   CO2 23 03/02/2011 2020   GLUCOSE 95 03/02/2011 2020   BUN 10 03/02/2011 2020   CREATININE 0.86 03/02/2011 2020   CALCIUM 9.9 03/02/2011 2020   GFRNONAA >90 03/02/2011 2020   GFRAA >90 03/02/2011 2020   Liver function  tests: AST 23, ALT 16, alkaline phosphatase 73, total bilirubin 0.8, total protein 8.2, 11 3.8  Lipase 118  Uric acid 10.1  Urinalysis: Cloudy, specific gravity 1.018, 40 ketones with large esterase and few squamous cells  CT abdomen pelvis: IMPRESSION:  1. Mild soft tissue inflammation about the pancreas raises concern  for mild pancreatitis. Underlying changes of chronic pancreatitis  noted.  2. Poorly characterized 2.4 cm hypodensity along the pancreatic  body. Though this could reflect a poorly characterized pseudocyst,  a mass cannot be excluded. MRCP could be considered for further  evaluation, as deemed clinically appropriate.  3. Prominent soft tissue density tracking along the antrum of the  stomach, with a dominant portion measuring 3.4 cm. This could  reflect sequelae of prior severe pancreatitis with chronic  phlegmon, though as described above, malignancy cannot be excluded.  4. Prominent peripancreatic nodes noted, measuring up to 1.0 cm in  short axis.  5. Chronic occlusion of the splenic vein, with prominent gastric  and splenic varices.  6. Mildly enlarged right inguinal nodes, measuring up to 1.1 cm in  short axis.  7. Degenerative subcortical cystic change at both femoral heads,  mildly worse on the right.  Left hand radiograph: IMPRESSION:  Periarticular soft tissue swelling at the MCP and PIP joints.  No acute osseous finding  Possible small erosion of the left third proximal phalanx at the  PIP joint.  Left wrist radiograph: IMPRESSION:  No acute osseous finding.   Impression/Plan: 51 year old African American gentleman history of hypertension, gout, a single bouts of pancreatitis in the remote past who presents for evaluation of abdominal pain for one and half weeks, currently afebrile hemodynamically stable with CT demonstrating findings consistent with acute on chronic pancreatitis. Stigmata of chronic pancreatitis include pseudocyst formation as  well as splenic vein thrombosis, which the patient was not aware of. In addition, there is an indeterminate lesion suspicious for possible pseudocyst/mass/abscess at the gastric antrum, particularly concerning given recent history of constitutional symptoms.  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 - Indeterminate mass adjacent to the gastric antrum, although this may also represent pseudocysts. However, given the constitutional of symptoms, concern is raised that there might be an additional malignancy. 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  Abnormal urinalysis: Of unclear etiology given patient denies any GU symptoms - Follow up urine culture, holding off on any treatment for now  Fluid/electrolytes/nutrition: - Fluids as  above - Monitor electrolytes daily - Advance diet as tolerated  Prophylaxis: - Lovenox  CODE STATUS: Full code

## 2011-03-04 LAB — COMPREHENSIVE METABOLIC PANEL
ALT: 13 U/L (ref 0–53)
AST: 17 U/L (ref 0–37)
Albumin: 2.8 g/dL — ABNORMAL LOW (ref 3.5–5.2)
Alkaline Phosphatase: 55 U/L (ref 39–117)
BUN: 9 mg/dL (ref 6–23)
CO2: 23 mEq/L (ref 19–32)
Calcium: 8.9 mg/dL (ref 8.4–10.5)
Chloride: 103 mEq/L (ref 96–112)
Creatinine, Ser: 0.85 mg/dL (ref 0.50–1.35)
GFR calc Af Amer: >90 mL/min (ref >90)
GFR calc non Af Amer: >90 mL/min (ref >90)
Glucose, Bld: 156 mg/dL — ABNORMAL HIGH (ref 70–99)
Potassium: 3.6 mEq/L (ref 3.5–5.1)
Sodium: 136 mEq/L (ref 135–145)
Total Bilirubin: 0.2 mg/dL — ABNORMAL LOW (ref 0.3–1.2)
Total Protein: 6.6 g/dL (ref 6.0–8.3)

## 2011-03-04 LAB — CBC
HCT: 34.9 % — ABNORMAL LOW (ref 39.0–52.0)
Hemoglobin: 11.7 g/dL — ABNORMAL LOW (ref 13.0–17.0)
MCV: 88.8 fL (ref 78.0–100.0)
RDW: 13.2 % (ref 11.5–15.5)
WBC: 6.3 10*3/uL (ref 4.0–10.5)

## 2011-03-04 LAB — LIPASE, BLOOD: Lipase: 43 U/L (ref 11–59)

## 2011-03-04 NOTE — Progress Notes (Signed)
   CARE MANAGEMENT NOTE 03/04/2011  Patient:  Hunter Ray, Hunter Ray   Account Number:  [id number]  Date Initiated:  03/04/2011  Documentation initiated by:  Lanier Clam  Subjective/Objective Assessment:   ADMITTED W/ABD PAIN.ACUTE O CHRONIC PANCREATITIS     Action/Plan:   FROM HOME   Anticipated DC Date:  03/08/2011   Anticipated DC Plan:  HOME/SELF CARE         Choice offered to / List presented to:             Status of service:  In process, will continue to follow Medicare Important Message given?   (If response is "NO", the following Medicare IM given date fields will be blank) Date Medicare IM given:   Date Additional Medicare IM given:    Discharge Disposition:    Per UR Regulation:  Reviewed for med. necessity/level of care/duration of stay  Comments:  03/04/11 [facility] Eyvette Cordon RN,BSN NCM [phone]

## 2011-03-04 NOTE — Progress Notes (Signed)
Hunter Ray  ZOX:[id number]  DOB: [birth year]  DOA: 03/02/2011  PCP: Colette Ribas, MD, MD  Subjective: Still epigastric pain, significantly improved from the time of admission, wants to upgrade diet today.  Objective: Weight change:  No intake or output data in the 24 hours ending 03/04/11 0949 Blood pressure 146/91, pulse 73, temperature 97.8 F (36.6 C), temperature source Oral, resp. rate 20, height 6\' 2"  (1.88 m), weight 96.163 kg (212 lb), SpO2 97.00%.  Physical Exam: General: Alert and awake, oriented x3, not in any acute distress. HEENT: anicteric sclera, pupils reactive to light and accommodation, EOMI CVS: S1-S2 clear, no murmur rubs or gallops Chest: clear to auscultation bilaterally, no wheezing, rales or rhonchi Abdomen: soft, mildly tender in the epigastric area, no rebound tenderness or guarding  Extremities: no cyanosis, clubbing or edema noted bilaterally Neuro: Cranial nerves II-XII intact, no focal neurological deficits  Lab Results: Basic Metabolic Panel:  Lab 03/04/11 8119 03/03/11 0520  NA 136 135  K 3.6 4.0  CL 103 101  CO2 23 22  GLUCOSE 156* 122*  BUN 9 7  CREATININE 0.85 0.86  CALCIUM 8.9 9.7  MG -- --  PHOS -- --   Liver Function Tests:  Lab 03/04/11 0535 03/02/11 2020  AST 17 23  ALT 13 16  ALKPHOS 55 73  BILITOT 0.2* 0.8  PROT 6.6 8.2  ALBUMIN 2.8* 3.8    Lab 03/04/11 0535 03/02/11 2020  LIPASE 43 118*  AMYLASE -- --   CBC:  Lab 03/04/11 0535 03/03/11 0520 03/02/11 2020  WBC 6.3 7.4 --  NEUTROABS -- -- 5.6  HGB 11.7* 13.0 --  HCT 34.9* 38.7* --  MCV 88.8 87.8 --  PLT 196 185 --     Micro Results: Recent Results (from the past 240 hour(s))  URINE CULTURE     Status: Normal   Collection Time   03/02/11  8:42 PM      Component Value Range Status Comment   Specimen Description URINE, CLEAN CATCH   Final    Special Requests NONE   Final    Setup Time [id number]   Final    Colony Count 5,000 COLONIES/ML    Final    Culture INSIGNIFICANT GROWTH   Final    Report Status 03/04/2011 FINAL   Final     Studies/Results: Dg Wrist Complete Left  03/02/2011  *RADIOLOGY REPORT*  Clinical Data: Pain, swelling, gout arthritis  LEFT WRIST - COMPLETE 3+ VIEW  Comparison: 10/02/2008  Findings: Normal alignment.  No fracture.  Intact distal radius, ulna and carpal bones.  Slight soft tissue swelling.  IMPRESSION: No acute osseous finding.  Original Report Authenticated By: Judie Petit. Ruel Favors, M.D.   Ct Abdomen Pelvis W Contrast  03/02/2011  *RADIOLOGY REPORT*  Clinical Data: Right-sided abdominal pain, nausea and vomiting.  CT ABDOMEN AND PELVIS WITH CONTRAST  Technique:  Multidetector CT imaging of the abdomen and pelvis was performed following the standard protocol during bolus administration of intravenous contrast.  Contrast: OMNIPAQUE IOHEXOL 300 MG/ML IV SOLN CT of the abdomen and pelvis performed 01/18/2004  Comparison: CT of the abdomen and pelvis performed 01/18/2004  Findings: Minimal bibasilar atelectasis is noted.  There is mild diffuse soft tissue inflammation about the pancreas, raising concern for pancreatitis.  Diffuse calcifications are noted within the pancreas, likely reflecting sequelae of chronic pancreatitis.  There is a poorly characterized 2.4 cm hypodensity along the pancreatic body; though this could reflect a poorly characterized pseudocyst, a mass cannot  be excluded given its appearance.  In addition, there is prominent soft tissue density tracking along the antrum of the stomach, with a dominant portion measuring 3.4 cm.  This could reflect sequelae of prior severe pancreatitis with chronic associated phlegmon, though as described above, malignancy cannot be excluded.  Scattered prominent peripancreatic nodes are also seen.  These measure up to 1.0 cm in short axis.  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  The liver and spleen  are unremarkable in appearance.  The gallbladder is within normal limits.  The adrenal glands are unremarkable.  Minimal nonspecific right-sided perinephric stranding is noted. The kidneys are otherwise unremarkable in appearance.  There is no evidence of hydronephrosis.  No renal or ureteral stones are seen.  No free fluid is identified.  The small bowel is unremarkable in appearance.  The stomach is within normal limits.  No acute vascular abnormalities are seen.  The appendix is normal in caliber, without evidence for appendicitis.  The colon is largely filled with contrast and is grossly unremarkable appearance.  The bladder is mildly distended and grossly unremarkable in appearance.  The prostate remains normal in size.  Scattered mildly enlarged right inguinal nodes are noted, measuring up to 1.1 cm in short axis.  No acute osseous abnormalities are identified.  Degenerative subcortical cystic change is noted at both femoral heads, mildly worse on the right.  IMPRESSION:  1.  Mild soft tissue inflammation about the pancreas raises concern for mild pancreatitis.  Underlying changes of chronic pancreatitis noted. 2.  Poorly characterized 2.4 cm hypodensity along the pancreatic body.  Though this could reflect a poorly characterized pseudocyst, a mass cannot be excluded.  MRCP could be considered for further evaluation, as deemed clinically appropriate. 3.  Prominent soft tissue density tracking along the antrum of the stomach, with a dominant portion measuring 3.4 cm.  This could reflect sequelae of prior severe pancreatitis with chronic phlegmon, though as described above, malignancy cannot be excluded. 4.  Prominent peripancreatic nodes noted, measuring up to 1.0 cm in short axis. 5.  Chronic occlusion of the splenic vein, with prominent gastric and splenic varices. 6.  Mildly enlarged right inguinal nodes, measuring up to 1.1 cm in short axis. 7.  Degenerative subcortical cystic change at both femoral heads,  mildly worse on the right.  Original Report Authenticated By: Tonia Ghent, M.D.   Mr 3d Recon At Scanner  03/03/2011  *RADIOLOGY REPORT*  Clinical Data:  Chronic pancreatitis.  Abdominal pain and vomiting. Indeterminate pancreatic lesions seen on recent CT.  MRI ABDOMEN WITHOUT AND WITH CONTRAST (MRCP)  Technique:  Multiplanar multisequence MR imaging of the abdomen was performed without and with contrast, including heavily T2-weighted images of the biliary and pancreatic ducts.  Three-dimensional MR images were rendered by post processing of the original MR data.  Contrast: 20mL MULTIHANCE GADOBENATE DIMEGLUMINE 529 MG/ML IV SOLN  Comparison:  CT on 03/02/2011  Findings:  Pancreatic calcifications are better shown on recent CT. There is a mild pancreatic swelling and peripancreatic inflammatory changes involving the body and tail, consistent with acute on chronic pancreatitis.  There is a thin rim enhancing cystic lesion is seen along the posterior aspect of the pancreatic tail which measures 1.7 x 3.1 cm and shows communication with the adjacent main pancreatic duct.  This is consistent with a small pancreatic pseudocyst.  In addition, there is a small rim enhancing fluid collection along the posterior serosal surface  of the gastric antrum which measures 1.2 x 2.5 cm. This is also consistent with a small pseudocyst.  No other peripancreatic or intra abdominal fluid collections are identified.  MRCP shows no evidence of biliary dilatation, stricture, or choledocholithiasis.  The common bile duct measures 4-5 mm in maximum diameter.  Gallbladder is unremarkable.  The pancreatic duct shows mild dilatation and " beaded appearance", particularly in the pancreatic body and tail, consistent with chronic pancreatitis.  Chronic splenic vein thrombosis again demonstrated, with numerous varices in the gastrosplenic and gastroc hepatic ligaments. No evidence of portal vein thrombosis.  Spleen is at the upper limits of  normal in size, measuring approximately 13 cm in length. No evidence of ascites.  No liver or splenic masses are identified.  The adrenal glands and kidneys are normal appearance.  No evidence of hydronephrosis.  No evidence of soft tissue masses or lymphadenopathy.  IMPRESSION:  1.  Mild acute on chronic pancreatitis.  Two small rim enhancing cystic lesions along the posterior aspects of the pancreatic tail and gastric antrum, consistent with pancreatic pseudocysts. 2.  No evidence of biliary dilatation or choledocholithiasis. Changes of chronic pancreatitis involving the pancreatic duct.  3.  Chronic splenic vein thrombosis, with gastrosplenic and gastrohepatic varices.  Borderline splenomegaly.  Original Report Authenticated By: Danae Orleans, M.D.   Dg Hand Complete Left  03/02/2011  *RADIOLOGY REPORT*  Clinical Data: Pain, swelling, gout arthritis  LEFT HAND - COMPLETE 3+ VIEW  Comparison: 10/04/2008  Findings: Fingers are flexed in  position.  Soft tissue swelling over the MCP joints and the PIP joints.  No fracture, subluxation or dislocation.  No radiographic foreign body.  Question small erosion of the third proximal phalanx at the PIP joint along the radial side.  IMPRESSION: Periarticular soft tissue swelling at the MCP and PIP joints.  No acute osseous finding  Possible small erosion of the left third proximal phalanx at the PIP joint.  Original Report Authenticated By: Judie Petit. Ruel Favors, M.D.   Mr Abd W/wo Cm/mrcp  03/03/2011  *RADIOLOGY REPORT*  Clinical Data:  Chronic pancreatitis.  Abdominal pain and vomiting. Indeterminate pancreatic lesions seen on recent CT.  MRI ABDOMEN WITHOUT AND WITH CONTRAST (MRCP)  Technique:  Multiplanar multisequence MR imaging of the abdomen was performed without and with contrast, including heavily T2-weighted images of the biliary and pancreatic ducts.  Three-dimensional MR images were rendered by post processing of the original MR data.  Contrast: 20mL MULTIHANCE  GADOBENATE DIMEGLUMINE 529 MG/ML IV SOLN  Comparison:  CT on 03/02/2011  Findings:  Pancreatic calcifications are better shown on recent CT. There is a mild pancreatic swelling and peripancreatic inflammatory changes involving the body and tail, consistent with acute on chronic pancreatitis.  There is a thin rim enhancing cystic lesion is seen along the posterior aspect of the pancreatic tail which measures 1.7 x 3.1 cm and shows communication with the adjacent main pancreatic duct.  This is consistent with a small pancreatic pseudocyst.  In addition, there is a small rim enhancing fluid collection along the posterior serosal surface of the gastric antrum which measures 1.2 x 2.5 cm. This is also consistent with a small pseudocyst.  No other peripancreatic or intra abdominal fluid collections are identified.  MRCP shows no evidence of biliary dilatation, stricture, or choledocholithiasis.  The common bile duct measures 4-5 mm in maximum diameter.  Gallbladder is unremarkable.  The pancreatic duct shows mild dilatation and " beaded appearance", particularly in the pancreatic body and tail,  consistent with chronic pancreatitis.  Chronic splenic vein thrombosis again demonstrated, with numerous varices in the gastrosplenic and gastroc hepatic ligaments. No evidence of portal vein thrombosis.  Spleen is at the upper limits of normal in size, measuring approximately 13 cm in length. No evidence of ascites.  No liver or splenic masses are identified.  The adrenal glands and kidneys are normal appearance.  No evidence of hydronephrosis.  No evidence of soft tissue masses or lymphadenopathy.  IMPRESSION:  1.  Mild acute on chronic pancreatitis.  Two small rim enhancing cystic lesions along the posterior aspects of the pancreatic tail and gastric antrum, consistent with pancreatic pseudocysts. 2.  No evidence of biliary dilatation or choledocholithiasis. Changes of chronic pancreatitis involving the pancreatic duct.  3.   Chronic splenic vein thrombosis, with gastrosplenic and gastrohepatic varices.  Borderline splenomegaly.  Original Report Authenticated By: Danae Orleans, M.D.    Medications: Scheduled Meds:   . enoxaparin  40 mg Subcutaneous Q24H  .  morphine injection  2 mg Intravenous Once  . multivitamin  1 tablet Oral Daily  . olmesartan  40 mg Oral Daily  . pantoprazole  40 mg Oral Q1200  . predniSONE  40 mg Oral Q breakfast  . thiamine  100 mg Intravenous Daily   Continuous Infusions:   . sodium chloride 100 mL/hr at 03/04/11 0401  . dextrose 5 % and 0.45% NaCl 100 mL/hr at 03/03/11 7829     Assessment/Plan: Principal Problem:  *Pancreatitis, acute on chronic: Likely secondary to alcohol use/relapse - Symptoms improving, lipase trending down, advance diet to low-fat solids today - Counseled strongly on alcohol cessation, no signs of alcohol withdrawal - MRCP reviewed, consistent with mild acute on chronic pancreatitis with small pseudocyst, no evidence of biliary dilatation or choledocholithiasis and chronic splenic vein thrombosis.  Active Problems:  Hypertension: BP stable   Gout attack: Symptoms resolved  Alcohol use: No signs of alcohol withdrawals, monitor closely  DVT Prophylaxis: Lovenox  Code Status: Full code  Disposition: Likely to DC home in a.m. if able to tolerate solids today.   LOS: 2 days   RAI,RIPUDEEP M.D. [facility] 03/04/2011, 9:49 AM
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
## 2011-03-05 LAB — CBC
HCT: 34 % — ABNORMAL LOW (ref 39.0–52.0)
Hemoglobin: 11.3 g/dL — ABNORMAL LOW (ref 13.0–17.0)
WBC: 5.2 10*3/uL (ref 4.0–10.5)

## 2011-03-05 LAB — BASIC METABOLIC PANEL
Calcium: 8.7 mg/dL (ref 8.4–10.5)
Creatinine, Ser: 0.81 mg/dL (ref 0.50–1.35)
GFR calc Af Amer: >90 mL/min (ref >90)
GFR calc non Af Amer: >90 mL/min (ref >90)
Sodium: 136 mEq/L (ref 135–145)

## 2011-03-05 MED ORDER — NAPROXEN 500 MG PO TABS: 500.0000 mg | ORAL_TABLET | Freq: Two times a day (BID) | ORAL | Status: AC

## 2011-03-05 MED ORDER — ALLOPURINOL 300 MG PO TABS: 300.0000 mg | ORAL_TABLET | Freq: Every day | ORAL | Status: DC

## 2011-03-05 MED ORDER — NAPROXEN 500 MG PO TABS
500.0000 mg | ORAL_TABLET | Freq: Two times a day (BID) | ORAL | Status: DC
  Administered 2011-03-05: 500 mg via ORAL
  Filled 2011-03-05 (×2): qty 1

## 2011-03-05 MED ORDER — PREDNISONE 10 MG PO TABS: 10.0000 mg | ORAL_TABLET | Freq: Every day | ORAL | Status: DC

## 2011-03-05 NOTE — Progress Notes (Signed)
[facility] Progress Note  Subjective: Feels much better and tolerated diet. No abdominal pain.  Objective: Vital signs in last 24 hours: Temp:  [97.7 F (36.5 C)-98.4 F (36.9 C)] 97.7 F (36.5 C) (01/16 0536) Pulse Rate:  [73-155] 73  (01/16 0536) Resp:  [18-20] 18  (01/16 0536) BP: (153-161)/(91-98) 161/98 mmHg (01/16 0536) SpO2:  [97 %-99 %] 99 % (01/16 0536) Weight change:    PE:No distress  Heart RRR Abdomen soft non tender  Lab Results: Results for orders placed during the hospital encounter of 03/02/11 (from the past 24 hour(s))  BASIC METABOLIC PANEL     Status: Abnormal   Collection Time   03/05/11  3:25 AM      Component Value Range   Sodium 136  135 - 145 (mEq/L)   Potassium 3.7  3.5 - 5.1 (mEq/L)   Chloride 105  96 - 112 (mEq/L)   CO2 23  19 - 32 (mEq/L)   Glucose, Bld 107 (*) 70 - 99 (mg/dL)   BUN 9  6 - 23 (mg/dL)   Creatinine, Ser 1.61  0.50 - 1.35 (mg/dL)   Calcium 8.7  8.4 - 09.6 (mg/dL)   GFR calc non Af Amer >90  >90 (mL/min)   GFR calc Af Amer >90  >90 (mL/min)  CBC     Status: Abnormal   Collection Time   03/05/11  3:25 AM      Component Value Range   WBC 5.2  4.0 - 10.5 (K/uL)   RBC 3.84 (*) 4.22 - 5.81 (MIL/uL)   Hemoglobin 11.3 (*) 13.0 - 17.0 (g/dL)   HCT 04.5 (*) 40.9 - 52.0 (%)   MCV 88.5  78.0 - 100.0 (fL)   MCH 29.4  26.0 - 34.0 (pg)   MCHC 33.2  30.0 - 36.0 (g/dL)   RDW 81.1  91.4 - 78.2 (%)   Platelets 212  150 - 400 (K/uL)  LIPASE, BLOOD     Status: Normal   Collection Time   03/05/11  3:25 AM      Component Value Range   Lipase 38  11 - 59 (U/L)    Studies/Results: @RISRSLT24 @    Assessment: Acute on chronic pancreatits with spelic vein thrombosis  Plan: OK for discharge. Follow up with his GI doctor in [place] F 03/05/2011, 11:10 AM

## 2011-03-05 NOTE — Discharge Summary (Signed)
Physician Discharge Summary  Patient ID: Hunter Ray MRN: [id number] DOB/AGE: [birth year] 51 y.o.  Admit date: 03-07-2011 Discharge date: 03/05/2011  Primary Care Physician:  Colette Ribas, MD, MD Gastroenterologist: Dr. Arline Asp  Discharge Diagnoses:   1. Acute on chronic Pancreatitis 2. splenic vein thrombosis 3. pancreatic pseudocysts 4. alcohol abuse 5. Hypertension 6. Chronic pancreatitis 7. Gout flare   Discharge Medication List as of 03/05/2011 11:20 AM    START taking these medications   Details  allopurinol (ZYLOPRIM) 300 MG tablet Take 1 tablet (300 mg total) by mouth daily., Starting 03/05/2011, Until Thu 03/04/12, Normal    naproxen (NAPROSYN) 500 MG tablet Take 1 tablet (500 mg total) by mouth 2 (two) times daily with a meal. For 5 days, Starting 03/05/2011, Until Mon 03/08/12, Normal      CONTINUE these medications which have CHANGED   Details  predniSONE (DELTASONE) 10 MG tablet Take 1 tablet (10 mg total) by mouth daily. Take 30mg  on 1/17 , 20mg  on 1/18, 10mg  on 1/19 then stop, Starting 03/05/2011, Until Discontinued, Normal      CONTINUE these medications which have NOT CHANGED   Details  olmesartan (BENICAR) 40 MG tablet Take 40 mg by mouth daily.  , Until Discontinued, Historical Med    colchicine 0.6 MG tablet Take 0.6 mg by mouth daily as needed. Gout flare-ups, Until Discontinued, Historical Med         Disposition and Follow-up:  1. PCP in one week 2. Dr. Barkley Bruns in 2 weeks  Consults:  [facility] Dr. Evette Cristal  Significant Diagnostic Studies:  Dg Wrist Complete Left 2011-03-07  .  IMPRESSION: No acute osseous finding.    Ct Abdomen Pelvis W Contrast 2011-03-07  IMPRESSION:  1.  Mild soft tissue inflammation about the pancreas raises concern for mild pancreatitis.  Underlying changes of chronic pancreatitis noted. 2.  Poorly characterized 2.4 cm hypodensity along the pancreatic body.  Though this could reflect a poorly characterized  pseudocyst, a mass cannot be excluded.  MRCP could be considered for further evaluation, as deemed clinically appropriate. 3.  Prominent soft tissue density tracking along the antrum of the stomach, with a dominant portion measuring 3.4 cm.  This could reflect sequelae of prior severe pancreatitis with chronic phlegmon, though as described above, malignancy cannot be excluded. 4.  Prominent peripancreatic nodes noted, measuring up to 1.0 cm in short axis. 5.  Chronic occlusion of the splenic vein, with prominent gastric and splenic varices. 6.  Mildly enlarged right inguinal nodes, measuring up to 1.1 cm in short axis. 7.  Degenerative subcortical cystic change at both femoral heads, mildly worse on the right.    MRCP:03/03/2011 IMPRESSION:  1.  Mild acute on chronic pancreatitis.  Two small rim enhancing cystic lesions along the posterior aspects of the pancreatic tail and gastric antrum, consistent with pancreatic pseudocysts. 2.  No evidence of biliary dilatation or choledocholithiasis. Changes of chronic pancreatitis involving the pancreatic duct.  3.  Chronic splenic vein thrombosis, with gastrosplenic and gastrohepatic varices.  Borderline splenomegaly.   Dg Hand Complete Left 2011/03/07  IMPRESSION: Periarticular soft tissue swelling at the MCP and PIP joints.  No acute osseous finding  Possible small erosion of the left third proximal phalanx at the PIP joint.     Brief H and P: Hunter Ray is a 51 year old African American gentleman with history of hypertension gout N. prior history pancreatitis presents with abdominal pain for 1-1/2 week associated with nausea, vomiting and poor appetite, evaluation in  the ER a CT of his abdomen which was consistent with acute and chronic pancreatitis with small ill-defined soft tissue densities which may represent pseudocysts as well as are splenic when thrombosis.  Hospital Course:  1. acute on chronic pancreatitis: He was treated conservatively with bowel  rest, anti-emetics,  IV fluids, had a CT of his abdomen and MRCP with results as dictated above. Seen by [facility] in consultation, recommended conservative management and followup with his primary gastroenterologist in [place] since he clinically improved very well. 2. gouty flare: Treated with a short course of prednisone, naproxen, colchicine and restarted on his allopurinol   Time spent on Discharge:  Signed: Zaiah Eckerson [facility]  03/05/2011, 3:19 PM

## 2011-03-13 ENCOUNTER — Telehealth: Payer: Self-pay | Admitting: Internal Medicine

## 2011-03-13 NOTE — Telephone Encounter (Signed)
Reviewed the medical record. Apparently admitted to [facility] long briefly for acute on chronic pancreatitis. It is reported in the medical record he has resumed alcohol consumption.  Apparently appointment next week was too soon. A significant other stated that February 15 was too far out. However, I feel that February 15 would be okay. I will be communicating to the patient. Also strongly recommended no further alcohol use.

## 2011-03-13 NOTE — Telephone Encounter (Signed)
CD spoke with patient and he agreed to OV on 2/15 at 1030 with LSL

## 2011-03-13 NOTE — Telephone Encounter (Signed)
Pt's wife has called the office insisting on her husband to see RMR or LSL on a Friday within 2 weeks from now. I told her that RMR has OV on 1/29 Tuesday at 9 available and his next Friday opening will be in March. She declined. I offered her OV with LSL on 2/15 and that wasn't soon enough. She wants this done on Friday ASAP or she will be searching for another GI doctor. I told her that I would ask RMR and LSL what they can advise and someone will call her back. [phone] or [phone]. Please advise when I can bring this patient in.

## 2011-03-13 NOTE — Telephone Encounter (Addendum)
I'm not sure why they are insisting on seeing me, I've never seen this patient before. Unfortunately I only have one appointment on Fridays. You can check with Dr. Jena Gauss. Otherwise there are other appointments available on other days.

## 2011-04-03 ENCOUNTER — Encounter: Payer: Self-pay | Admitting: Gastroenterology

## 2011-04-03 ENCOUNTER — Ambulatory Visit (INDEPENDENT_AMBULATORY_CARE_PROVIDER_SITE_OTHER): Payer: BC Managed Care – PPO | Admitting: Gastroenterology

## 2011-04-03 DIAGNOSIS — D649 Anemia, unspecified: Secondary | ICD-10-CM

## 2011-04-03 DIAGNOSIS — K861 Other chronic pancreatitis: Secondary | ICD-10-CM
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
## 2011-04-04 ENCOUNTER — Ambulatory Visit: Payer: BC Managed Care – PPO | Admitting: Gastroenterology

## 2011-04-04 ENCOUNTER — Encounter: Payer: Self-pay | Admitting: Gastroenterology

## 2011-04-04 NOTE — Assessment & Plan Note (Signed)
F/U low hemoglobin during recent hospitalization.

## 2011-04-04 NOTE — Assessment & Plan Note (Signed)
Recent diagnosis of chronic pancreatitis during hospitalization for acute pancreatitis in setting of increase etoh use. Calcium level normal. Triglycerides not check. Two pancreatic pseudocysts seen. Also with chronic splenic vein thrombosis and gastrosplenic/gastrohepatic varices.   Clinically patient doing well at this time. He denies further etoh use. Question is whether he should have EGD to look for varices as per Dr. Luan Moore suggestion. Will discuss with Dr. Jena Gauss.  Patient has been told if he develops recurrent abdominal pain, n/v, diarrhea he should let us know. Hold on pancreatic enzymes at this time. Hopefully his pseudocysts will resolved without complication.

## 2011-04-07 NOTE — Progress Notes (Signed)
Faxed to PCP

## 2011-04-11 LAB — CBC WITH DIFFERENTIAL/PLATELET
Eosinophils Relative: 4 % (ref 0–5)
HCT: 44.1 % (ref 39.0–52.0)
Lymphocytes Relative: 24 % (ref 12–46)
Lymphs Abs: 1.9 10*3/uL (ref 0.7–4.0)
MCV: 91.7 fL (ref 78.0–100.0)
Monocytes Absolute: 0.7 10*3/uL (ref 0.1–1.0)
RBC: 4.81 MIL/uL (ref 4.22–5.81)
WBC: 7.7 10*3/uL (ref 4.0–10.5)

## 2011-04-21 NOTE — Progress Notes (Signed)
Quick Note:  CBC good. Still waiting response regarding if patient needs EGD. Will discuss with Dr. Jena Gauss. ______

## 2011-05-21 NOTE — Progress Notes (Signed)
Quick Note:  Please let pt know. Finally heard back from [facility]. He does not recommend EGD at this time to look for gastric varices (seen on CT during hospitalization) as there are no guidelines specifying this needs to be done.  Would recommend OV f/u in two months with [facility] only to f/u etoh pancreatitis. ______

## 2011-05-23 NOTE — Progress Notes (Signed)
Quick Note:  Tried to call pt- LMOM ______ 

## 2011-05-26 NOTE — Progress Notes (Signed)
Quick Note:  Mailed letter to pt ______ 

## 2011-07-03 ENCOUNTER — Encounter: Payer: Self-pay | Admitting: Gastroenterology

## 2011-07-03 ENCOUNTER — Ambulatory Visit (INDEPENDENT_AMBULATORY_CARE_PROVIDER_SITE_OTHER): Payer: BC Managed Care – PPO | Admitting: Gastroenterology

## 2011-07-03 VITALS — BP 144/84 | HR 81 | Temp 98.1°F | Ht 74.0 in | Wt 232.0 lb

## 2011-07-03 DIAGNOSIS — K861 Other chronic pancreatitis: Secondary | ICD-10-CM

## 2011-07-03 DIAGNOSIS — R1013 Epigastric pain: Secondary | ICD-10-CM

## 2011-07-03 NOTE — Progress Notes (Signed)
Primary Care Physician: Colette Ribas, MD, MD  Primary Gastroenterologist:  Roetta Sessions, MD   Chief Complaint  Patient presents with  . Abdominal Pain    HPI: Hunter Ray is a 51 y.o. male here for h/o abdominal pain. His wife made appointment couple of weeks ago.   Patient reports that he had recurrent abdominal pain since his OV here few months ago. Two months of intermittent epigastric pain, not as severe as with pancreatitis. However, he really had not had any abdominal pain in past two to three weeks. With the pain, he lost his appetite, but it is back to normal. He has gained 10 pounds. No etoh since in hospital with pancreatitis. Bowel movements regular without melena, brbpr.   Intermittent naproxen for joint pain. Last time taking prednisone about one month ago.   Current Outpatient Prescriptions  Medication Sig Dispense Refill  . allopurinol (ZYLOPRIM) 300 MG tablet Take 1 tablet (300 mg total) by mouth daily.  30 tablet  0  . colchicine 0.6 MG tablet Take 0.6 mg by mouth daily as needed. Gout flare-ups      . naproxen (NAPROSYN) 500 MG tablet Take 1 tablet (500 mg total) by mouth 2 (two) times daily with a meal. For 5 days  10 tablet  0  . olmesartan (BENICAR) 40 MG tablet Take 40 mg by mouth daily.        . predniSONE (DELTASONE) 10 MG tablet Take 1 tablet (10 mg total) by mouth daily. Take 30mg  on 1/17 , 20mg  on 1/18, 10mg  on 1/19 then stop  10 tablet  0    Allergies as of 07/03/2011  . (No Known Allergies)    ROS:  General: Negative for anorexia, weight loss, fever, chills, fatigue, weakness. ENT: Negative for hoarseness, difficulty swallowing , nasal congestion. CV: Negative for chest pain, angina, palpitations, dyspnea on exertion, peripheral edema.  Respiratory: Negative for dyspnea at rest, dyspnea on exertion, cough, sputum, wheezing.  GI: See history of present illness. GU:  Negative for dysuria, hematuria, urinary incontinence, urinary  frequency, nocturnal urination.  Endo: Weight up 10 pounds since 03/2011   Physical Examination:   BP 144/84  Pulse 81  Temp(Src) 98.1 F (36.7 C) (Temporal)  Ht 6\' 2"  (1.88 m)  Wt 232 lb (105.235 kg)  BMI 29.79 kg/m2  General: Well-nourished, well-developed in no acute distress.  Eyes: No icterus. Mouth: Oropharyngeal mucosa moist and pink , no lesions erythema or exudate. Lungs: Clear to auscultation bilaterally.  Heart: Regular rate and rhythm, no murmurs rubs or gallops.  Abdomen: Bowel sounds are normal, nontender, nondistended, no hepatosplenomegaly or masses, no abdominal bruits or hernia , no rebound or guarding.   Extremities: No lower extremity edema. No clubbing or deformities. Neuro: Alert and oriented x 4   Skin: Warm and dry, no jaundice.   Psych: Alert and cooperative, normal mood and affect.

## 2011-07-03 NOTE — Assessment & Plan Note (Signed)
Couple month h/o epigastric pain with loss of appetite which resolved over the past 2-3 weeks. No longer having symptoms. History significant for hospitalization (02/2011) for acute pancreatitis. CT scan showed mild pancreatitis but underlying changes of chronic pancreatitis with a poorly characterized 2.4 cm hypodensity along the pancreatic body and soft tissue density tracking along the antrum of the stomach. There was chronic occlusion of the splenic vein noted with prominent gastric and splenic varices. Follow-up MRI was done which showed mild acute on chronic pancreatitis. 2 small rim enhancing cystic lesions on the posterior aspect pancreatic tail and gastric antrum consistent with pancreatic pseudocyst. 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  If patient has recurrent pain, he should have labs looking for pancreatitis. Would consider EGD given intermittent use of NSAIDS and prednisone. Consider reimaging pancreas to make sure pseudocysts resolved. Consider adding pancreatic enzymes at that time. He will call with any recurrent symptoms.   Move office visit up to 09/2011 with Dr. Jena Gauss only.

## 2011-07-03 NOTE — Patient Instructions (Signed)
We will hold off on any test right now since your abdominal pain has resolved. If you have recurrent abdominal pain, we would recommend you call to be scheduled for labs, upper endoscopy or CT based on symptoms at that time.  Office visit with Dr. Jena Gauss in 09/2011.

## 2011-07-03 NOTE — Progress Notes (Signed)
Faxed to PCP

## 2011-09-18 ENCOUNTER — Encounter: Payer: Self-pay | Admitting: Internal Medicine

## 2011-12-11 ENCOUNTER — Other Ambulatory Visit: Payer: Self-pay | Admitting: Gastroenterology

## 2011-12-11 ENCOUNTER — Ambulatory Visit (INDEPENDENT_AMBULATORY_CARE_PROVIDER_SITE_OTHER): Payer: BC Managed Care – PPO | Admitting: Gastroenterology

## 2011-12-11 ENCOUNTER — Ambulatory Visit (HOSPITAL_COMMUNITY)
Admission: RE | Admit: 2011-12-11 | Discharge: 2011-12-11 | Disposition: A | Discharge status: Home / Self Care | Payer: BC Managed Care – PPO | Source: Ambulatory Visit | Attending: Gastroenterology | Admitting: Gastroenterology

## 2011-12-11 ENCOUNTER — Encounter: Payer: Self-pay | Admitting: Gastroenterology

## 2011-12-11 VITALS — BP 153/102 | HR 80 | Temp 98.5°F | Ht 74.0 in | Wt 242.4 lb

## 2011-12-11 DIAGNOSIS — R1031 Right lower quadrant pain: Secondary | ICD-10-CM | POA: Insufficient documentation

## 2011-12-11 DIAGNOSIS — R109 Unspecified abdominal pain: Secondary | ICD-10-CM | POA: Insufficient documentation

## 2011-12-11 DIAGNOSIS — Z8719 Personal history of other diseases of the digestive system: Secondary | ICD-10-CM

## 2011-12-11 LAB — CBC WITH DIFFERENTIAL/PLATELET
Basophils Relative: 0 % (ref 0–1)
Eosinophils Absolute: 0.1 10*3/uL (ref 0.0–0.7)
HCT: 41.2 % (ref 39.0–52.0)
Hemoglobin: 14.5 g/dL (ref 13.0–17.0)
MCH: 29.5 pg (ref 26.0–34.0)
MCHC: 35.2 g/dL (ref 30.0–36.0)
Monocytes Absolute: 0.3 10*3/uL (ref 0.1–1.0)
Monocytes Relative: 8 % (ref 3–12)
Neutro Abs: 2.3 10*3/uL (ref 1.7–7.7)

## 2011-12-11 LAB — COMPREHENSIVE METABOLIC PANEL
Albumin: 4.5 g/dL (ref 3.5–5.2)
Alkaline Phosphatase: 57 U/L (ref 39–117)
BUN: 17 mg/dL (ref 6–23)
CO2: 25 mEq/L (ref 19–32)
Calcium: 9.6 mg/dL (ref 8.4–10.5)
Glucose, Bld: 87 mg/dL (ref 70–99)
Potassium: 3.8 mEq/L (ref 3.5–5.3)

## 2011-12-11 LAB — LIPASE: Lipase: 32 U/L (ref 11–59)

## 2011-12-11 MED ORDER — IOHEXOL 300 MG/ML  SOLN
100.0000 mL | Freq: Once | INTRAMUSCULAR | Status: AC | PRN
  Administered 2011-12-11: 100 mL via INTRAVENOUS

## 2011-12-11 NOTE — Assessment & Plan Note (Signed)
48 hour h/o right sided abdominal pain different from prior episodes of pancreatitis. He does have h/o pancreatitis complicated by finding of chronic pancreatitis, splenic vein thrombosis, pseudocyst formation. Has been doing well since 02/2011. Current symptoms acute without known injury. Ddx includes acute appendicitis, cholecystitis, musculoskeletal, Shingles prodrome, PUD. Given acuteness of pain, will order STAT labs and STAT CT A/P with iv/oral contrast. Further recommendations to follow.

## 2011-12-11 NOTE — Progress Notes (Signed)
CALLED JULIE RN AT [facility] WITH LOW PLATELET RESULT OF 149, 12/11/2011 @1412 

## 2011-12-11 NOTE — Progress Notes (Signed)
Primary Care Physician: Colette Ribas, MD  Primary Gastroenterologist:  Roetta Sessions, MD   Chief Complaint  Patient presents with  . Pain    right side    HPI: Hunter Ray is a 51 y.o. male here with 48 hours history of progressive Right sided abdominal pain. Worse with movement. Denies fever. Denies injury. Symptoms started Tuesday but progressively worse through now. Pain rated 10/10 currently. Denies n/v, pp abdominal pain. No dysuria, no hematuria. BM regular, melena, brbpr. No fever. No cough or congestion. Heavy lifting at work. No known injury. Naproxen prn. No ASA. No gout in five months.  Weight up 20 pounds since 11/2010. States he had similar pain in July or August and went to urgent care. Urinalysis was unremarkable per patient but no other work-up done. Given pain medication and eventually the pain went away. Different from pancreas pain.   Patient has h/o chronic pancreatitis. Acute pancreatitis in 02/2011 complicated by small pseudocyst formation. He has splenic vein thrombosis. Denies etoh use since 02/2011.     Current Outpatient Prescriptions  Medication Sig Dispense Refill  . allopurinol (ZYLOPRIM) 300 MG tablet Take 1 tablet (300 mg total) by mouth daily.  30 tablet  0  . colchicine 0.6 MG tablet Take 0.6 mg by mouth daily. Gout flare-ups      . naproxen (NAPROSYN) 500 MG tablet Take 1 tablet (500 mg total) by mouth 2 (two) times daily with a meal. For 5 days  10 tablet  0  . olmesartan (BENICAR) 40 MG tablet Take 40 mg by mouth daily.          Allergies as of 12/11/2011  . (No Known Allergies)    ROS:  General: Negative for anorexia, weight loss, fever, chills, fatigue, weakness. ENT: Negative for hoarseness, difficulty swallowing , nasal congestion. CV: Negative for chest pain, angina, palpitations, dyspnea on exertion, peripheral edema.  Respiratory: Negative for dyspnea at rest, dyspnea on exertion, cough, sputum, wheezing.  GI: See history of  present illness. GU:  Negative for dysuria, hematuria, urinary incontinence, urinary frequency, nocturnal urination.  Endo: Negative for unusual weight change.    Physical Examination:   BP 153/102  Pulse 80  Temp 98.5 F (36.9 C) (Temporal)  Ht 6\' 2"  (1.88 m)  Wt 242 lb 6.4 oz (109.952 kg)  BMI 31.12 kg/m2  General: Well-nourished, well-developed in no acute distress. Appears very uncomfortable.  Eyes: No icterus. Mouth: Oropharyngeal mucosa moist and pink , no lesions erythema or exudate. Lungs: Clear to auscultation bilaterally.  Heart: Regular rate and rhythm, no murmurs rubs or gallops.  Abdomen: Bowel sounds are normal, moderate RLQ tenderness. No ribcage tenderness. No CVA tenderness or point tenderness over the spine. No rash. Nondistended, no hepatosplenomegaly or masses, no abdominal bruits or hernia , no rebound or guarding.   Extremities: No lower extremity edema. No clubbing or deformities. Neuro: Alert and oriented x 4   Skin: Warm and dry, no jaundice.   Psych: Alert and cooperative, normal mood and affect.

## 2011-12-11 NOTE — Progress Notes (Signed)
Faxed to PCP

## 2011-12-11 NOTE — Progress Notes (Signed)
Quick Note:  Overall labs ok. Await CT findings. ______

## 2011-12-11 NOTE — Progress Notes (Signed)
Quick Note:  rx called to [facility] [address], [place].  Darl Pikes, please schedule appt. Benedetto Goad, please do referral ______

## 2011-12-11 NOTE — Progress Notes (Signed)
Quick Note:  DISCUSSED ALL OF THE BELOW WITH PATIENT.  Discussed CT findings with Dr. Tyron Russell. Per Dr. Tyron Russell, his pancreas looks much better than back in 02/2011 and 03/2011. Some vague haziness around mid-pancreas but pseudocysts gone. No cholecystitis or appendicitis.   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 Possibly from prior pancreatitis and etoh use. I discussed case with Dr. Jena Gauss. His current pain may be secondary to disease process of the right hip. He recommends referral to Dr. Fuller Canada.  Please make referral to Dr. Fuller Canada ASAP for acute pain possibly secondary to avascular necrosis of the hips. Please make f/u appointment with [facility] only in 4 months regarding chronic pancreatitis.  Please call in vicodin 5/500mg  one po every 4-6 hours prn pain, #30, zero refills. ______

## 2011-12-11 NOTE — Patient Instructions (Addendum)
Please have your blood work and CT scan done.

## 2011-12-12 NOTE — Progress Notes (Unsigned)
Patient ID: Hunter Ray, male   DOB: Feb 02, 1961, 51 y.o.   MRN: [id number] Pt's wife called this morning wanting to know if he could an orthopedic in [place]. He lives in [place] so it would be easier for him to go there. Leigh-Ann called  Ortho and they told her that the Pt would have to call them before we could send over the referral. Pt is aware that he will need to call them first.

## 2011-12-12 NOTE — Progress Notes (Signed)
I made the referral to Dr. Romeo Apple and patients wife called and cancelled it they want to see orthopedic in [facility] and I called to refer him to [facility] and they need the patient to call them first before referral can be made and Ginger is going to relay that message to patients wife

## 2011-12-15 ENCOUNTER — Telehealth: Payer: Self-pay | Admitting: Gastroenterology

## 2011-12-15 NOTE — Telephone Encounter (Signed)
Patient is scheduled with [facility] with Dr. Juliene Pina on Nov 12th at 8:45 and I left a mess with Patients girlfriend with the information

## 2011-12-26 ENCOUNTER — Telehealth: Payer: Self-pay | Admitting: Gastroenterology

## 2011-12-26 NOTE — Telephone Encounter (Signed)
Patient needs f/u OV with [facility] only (chronic pancreatitis) in 03/2012.   Requested in result note attached to CT scan report but no NIC or appt made yet.

## 2011-12-30 NOTE — Telephone Encounter (Signed)
Reminder in epic to follow up with RMR ONLY in Feb 2014

## 2012-10-07 ENCOUNTER — Other Ambulatory Visit (HOSPITAL_COMMUNITY): Payer: Self-pay | Admitting: Family Medicine

## 2012-10-07 DIAGNOSIS — M542 Cervicalgia: Secondary | ICD-10-CM

## 2012-10-07 DIAGNOSIS — M5030 Other cervical disc degeneration, unspecified cervical region: Secondary | ICD-10-CM

## 2012-10-11 ENCOUNTER — Ambulatory Visit (HOSPITAL_COMMUNITY)
Admission: RE | Admit: 2012-10-11 | Discharge: 2012-10-11 | Disposition: A | Discharge status: Home / Self Care | Payer: BC Managed Care – PPO | Source: Ambulatory Visit | Attending: Family Medicine | Admitting: Family Medicine

## 2012-10-11 DIAGNOSIS — M5380 Other specified dorsopathies, site unspecified: Secondary | ICD-10-CM | POA: Insufficient documentation

## 2012-10-11 DIAGNOSIS — M5030 Other cervical disc degeneration, unspecified cervical region: Secondary | ICD-10-CM

## 2012-10-11 DIAGNOSIS — M5020 Other cervical disc displacement, unspecified cervical region: Secondary | ICD-10-CM | POA: Insufficient documentation

## 2012-10-11 DIAGNOSIS — M542 Cervicalgia: Secondary | ICD-10-CM

## 2013-10-13 ENCOUNTER — Ambulatory Visit (HOSPITAL_COMMUNITY)
Admission: RE | Admit: 2013-10-13 | Discharge: 2013-10-13 | Disposition: A | Discharge status: Home / Self Care | Payer: BC Managed Care – PPO | Source: Ambulatory Visit | Attending: Family Medicine | Admitting: Family Medicine

## 2013-10-13 ENCOUNTER — Other Ambulatory Visit (HOSPITAL_COMMUNITY): Payer: Self-pay | Admitting: Family Medicine

## 2013-10-13 DIAGNOSIS — M259 Joint disorder, unspecified: Secondary | ICD-10-CM | POA: Diagnosis not present

## 2013-10-13 DIAGNOSIS — M25562 Pain in left knee: Secondary | ICD-10-CM

## 2013-10-13 DIAGNOSIS — M25569 Pain in unspecified knee: Secondary | ICD-10-CM | POA: Insufficient documentation

## 2013-12-27 ENCOUNTER — Ambulatory Visit (INDEPENDENT_AMBULATORY_CARE_PROVIDER_SITE_OTHER): Payer: BC Managed Care – PPO | Admitting: Urology

## 2013-12-27 DIAGNOSIS — R350 Frequency of micturition: Secondary | ICD-10-CM

## 2013-12-27 DIAGNOSIS — R3915 Urgency of urination: Secondary | ICD-10-CM

## 2013-12-27 DIAGNOSIS — N529 Male erectile dysfunction, unspecified: Secondary | ICD-10-CM

## 2013-12-27 DIAGNOSIS — R351 Nocturia: Secondary | ICD-10-CM

## 2015-12-25 ENCOUNTER — Telehealth: Payer: Self-pay | Admitting: Internal Medicine

## 2015-12-25 NOTE — Telephone Encounter (Signed)
Pt's wife called to set up husband colonoscopy. She said his last one was done in 2012 and he had polyps and has a family history of colon cancer. I told her that he was on the recall list to repeat colonoscopy in 7 years and would be due in Nov 2019. She said that he wasn't having any problems and was just wanting to follow up on that. Patient has [place] and I told her that I would let the triage know that she had called and with his insurance he may not need an OV but a triage. Please advise if patient can be triaged now or does he need to wait until 2019. [phone]

## 2016-01-01 NOTE — Telephone Encounter (Signed)
LMOM to call.

## 2016-01-02 NOTE — Telephone Encounter (Signed)
LMOM to call and mailing a letter also to call.

## 2016-07-31 ENCOUNTER — Encounter: Payer: Self-pay | Admitting: Podiatry

## 2016-07-31 ENCOUNTER — Ambulatory Visit (INDEPENDENT_AMBULATORY_CARE_PROVIDER_SITE_OTHER): Payer: BLUE CROSS/BLUE SHIELD | Admitting: Podiatry

## 2016-07-31 ENCOUNTER — Ambulatory Visit (INDEPENDENT_AMBULATORY_CARE_PROVIDER_SITE_OTHER): Payer: BLUE CROSS/BLUE SHIELD

## 2016-07-31 ENCOUNTER — Ambulatory Visit: Payer: BLUE CROSS/BLUE SHIELD

## 2016-07-31 VITALS — BP 151/99 | HR 72 | Resp 16 | Ht 74.0 in | Wt 260.0 lb

## 2016-07-31 DIAGNOSIS — M79672 Pain in left foot: Principal | ICD-10-CM

## 2016-07-31 DIAGNOSIS — M7750 Other enthesopathy of unspecified foot: Secondary | ICD-10-CM | POA: Diagnosis not present

## 2016-07-31 DIAGNOSIS — M79671 Pain in right foot: Secondary | ICD-10-CM

## 2016-07-31 DIAGNOSIS — M205X9 Other deformities of toe(s) (acquired), unspecified foot: Secondary | ICD-10-CM | POA: Diagnosis not present

## 2016-07-31 DIAGNOSIS — M779 Enthesopathy, unspecified: Secondary | ICD-10-CM

## 2016-07-31 MED ORDER — TRIAMCINOLONE ACETONIDE 10 MG/ML IJ SUSP
10.0000 mg | Freq: Once | INTRAMUSCULAR | Status: AC
  Administered 2016-07-31: 10 mg

## 2016-07-31 NOTE — Progress Notes (Signed)
Subjective:    Patient ID: Hunter Ray, male   DOB: 56 y.o.   MRN: [id number]   HPI patient presents stating he's had a lot of pain on the outside of both feet and he does not remember specific injury and also his third fourth and fifth nails of his left foot are bruised and discolored    Review of Systems  All other systems reviewed and are negative.       Objective:  Physical Exam  Constitutional: He is oriented to person, place, and time.  Cardiovascular: Intact distal pulses.   Musculoskeletal: Normal range of motion.  Neurological: He is alert and oriented to person, place, and time.  Skin: Skin is warm.  Nursing note and vitals reviewed.  neurovascular status found to be intact muscle strength adequate range of motion within normal limits with patient found to have quite a bit of discomfort in the lateral sides of both feet around the peroneal insertion into the base of fifth metatarsal. Patient's also noted to have discoloration of the third fourth and fifth nails left that's localized in nature with no other pathology and is noted to have good digital perfusion and well oriented 3     Assessment:    Acute peroneal tendinitis left and right fifth metatarsal base at the insertion along with nail trauma of the third fourth fifth nails     Plan:   H&P x-rays reviewed and at this time careful peroneal injections administered to the lateral side of the fifth metatarsal base bilateral 3 mg Kenalog 5 mg Xylocaine advised on ice therapy and dispensed fascial brace to lift the lateral side of the foot from the medial lateral direction. Also placed on diclofenac 75 mg twice a day and reappoint to recheck  X-rays indicated that there is no signs of bone pathology appears to be soft tissue

## 2016-07-31 NOTE — Progress Notes (Signed)
   Subjective:    Patient ID: Hunter Ray, male    DOB: May 17, 1960, 56 y.o.   MRN: [id number]  HPI Chief Complaint  Patient presents with  . Painful lesions    Bilateral; plantar forefoot-below 5th toes; x1 month  . Nail Problem    Left foot; 3rd, 4th and 5th toes; nail discoloration  & thickened nails; x2 months      Review of Systems  Musculoskeletal: Positive for gait problem.  All other systems reviewed and are negative.      Objective:   Physical Exam        Assessment & Plan:

## 2016-08-14 ENCOUNTER — Encounter: Payer: Self-pay | Admitting: Podiatry

## 2016-08-14 ENCOUNTER — Ambulatory Visit (INDEPENDENT_AMBULATORY_CARE_PROVIDER_SITE_OTHER): Payer: BLUE CROSS/BLUE SHIELD | Admitting: Podiatry

## 2016-08-14 DIAGNOSIS — L84 Corns and callosities: Secondary | ICD-10-CM | POA: Diagnosis not present

## 2016-08-14 DIAGNOSIS — M779 Enthesopathy, unspecified: Secondary | ICD-10-CM

## 2016-08-14 MED ORDER — DICLOFENAC SODIUM 75 MG PO TBEC: 75.0000 mg | DELAYED_RELEASE_TABLET | Freq: Two times a day (BID) | ORAL | 2 refills | Status: DC

## 2016-08-14 MED ORDER — TRIAMCINOLONE ACETONIDE 10 MG/ML IJ SUSP
10.0000 mg | Freq: Once | INTRAMUSCULAR | Status: AC
  Administered 2016-08-14: 10 mg

## 2016-08-14 NOTE — Progress Notes (Signed)
Subjective:    Patient ID: Hunter Ray, male   DOB: 56 y.o.   MRN: [id number]   HPI patient states the area with the injections occurred is doing well but patient having a lot of pain underneath the fifth metatarsals both feet with lesion formation and he realizes this is where the majority of this pain is coming from    ROS      Objective:  Physical Exam inflammatory capsulitis fifth MPJ bilateral with keratotic lesion and probable plantar flex metatarsals     Assessment:   H&P conditions reviewed and today I injected the capsule 3 mg Kenalog 5 mill grams Xylocaine and did deep debridement of lesions and applied padding. Reappoint 8 weeks may require surgery eventually or some type of orthotic to reduce pressure against the fifth metatarsals      Plan:     Discussed above

## 2016-10-09 ENCOUNTER — Ambulatory Visit (INDEPENDENT_AMBULATORY_CARE_PROVIDER_SITE_OTHER): Payer: BLUE CROSS/BLUE SHIELD | Admitting: Podiatry

## 2016-10-09 DIAGNOSIS — L84 Corns and callosities: Secondary | ICD-10-CM

## 2016-10-09 DIAGNOSIS — M779 Enthesopathy, unspecified: Secondary | ICD-10-CM | POA: Diagnosis not present

## 2016-10-09 NOTE — Progress Notes (Signed)
Subjective:    Patient ID: Hunter Ray, male   DOB: 56 y.o.   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 I am referring him to the head orthotist for evaluation I have recommended a softer type device with fifth metatarsal depressions or cut out to try to take pressure off these lesions. He will be seen by him for orthotic treatment

## 2016-10-13 ENCOUNTER — Other Ambulatory Visit: Payer: BLUE CROSS/BLUE SHIELD | Admitting: Orthotics

## 2016-11-06 ENCOUNTER — Ambulatory Visit (INDEPENDENT_AMBULATORY_CARE_PROVIDER_SITE_OTHER): Payer: BLUE CROSS/BLUE SHIELD | Admitting: Orthotics

## 2016-11-06 DIAGNOSIS — M205X9 Other deformities of toe(s) (acquired), unspecified foot: Secondary | ICD-10-CM

## 2016-11-06 DIAGNOSIS — M7750 Other enthesopathy of unspecified foot: Secondary | ICD-10-CM | POA: Diagnosis not present

## 2016-11-06 DIAGNOSIS — L84 Corns and callosities: Secondary | ICD-10-CM

## 2016-11-06 DIAGNOSIS — M779 Enthesopathy, unspecified: Secondary | ICD-10-CM

## 2016-11-06 NOTE — Progress Notes (Signed)
Patient came in today to pick up custom made foot orthotics.  The goals were accomplished and the patient reported no dissatisfaction with said orthotics.  Patient was advised of breakin period and how to report any issues. 

## 2016-11-13 NOTE — Addendum Note (Signed)
Addended by: Velora Heckler on: 11/13/2016 10:04 PM   Modules accepted: Level of Service

## 2016-12-26 ENCOUNTER — Telehealth: Payer: Self-pay | Admitting: *Deleted

## 2016-12-26 NOTE — Telephone Encounter (Signed)
Refill request of Diclofenac. Dr. Paulla Dolly states if pt is continuing to have pain, he needs an appt. Return fax denying.

## 2016-12-30 ENCOUNTER — Telehealth: Payer: Self-pay | Admitting: *Deleted

## 2016-12-30 NOTE — Telephone Encounter (Signed)
Refill request for diclofenac. Dr. Paulla Dolly states if pt continues to have pain he needs to be seen prior to future refills. Return fax denying.

## 2017-05-28 ENCOUNTER — Ambulatory Visit: Payer: BLUE CROSS/BLUE SHIELD

## 2017-05-28 ENCOUNTER — Ambulatory Visit (INDEPENDENT_AMBULATORY_CARE_PROVIDER_SITE_OTHER): Payer: BLUE CROSS/BLUE SHIELD | Admitting: Podiatry

## 2017-05-28 ENCOUNTER — Encounter: Payer: Self-pay | Admitting: Podiatry

## 2017-05-28 DIAGNOSIS — M205X9 Other deformities of toe(s) (acquired), unspecified foot: Secondary | ICD-10-CM

## 2017-05-28 DIAGNOSIS — M779 Enthesopathy, unspecified: Secondary | ICD-10-CM

## 2017-05-28 DIAGNOSIS — L84 Corns and callosities: Secondary | ICD-10-CM | POA: Diagnosis not present

## 2017-05-28 NOTE — Progress Notes (Signed)
Subjective:   Patient ID: Hunter Ray, male   DOB: 57 y.o.   MRN: [id number]   HPI Patient presents stating he had trouble with his orthotics and the lesions have come back on both feet that are sore   ROS      Objective:  Physical Exam  Neurovascular status intact with porokeratotic type lesions plantar aspect fifth metatarsal bilateral that are painful when palpated     Assessment:  Lesion secondary to pain with pressure that is formed     Plan:  H&P x-rays reviewed and sharp deep debridement accomplished along with salicylic acid application with sterile dressing.  Reappoint for [place] to recheck  X-rays indicate no signs of fracture or other bone pathology around the fifth metatarsals of both feet

## 2017-06-10 ENCOUNTER — Other Ambulatory Visit: Payer: Self-pay

## 2017-06-10 MED ORDER — DICLOFENAC SODIUM 75 MG PO TBEC: 75.0000 mg | DELAYED_RELEASE_TABLET | Freq: Two times a day (BID) | ORAL | 0 refills | Status: DC

## 2017-06-29 ENCOUNTER — Telehealth: Payer: Self-pay | Admitting: *Deleted

## 2017-06-29 MED ORDER — DICLOFENAC SODIUM 75 MG PO TBEC: 75.0000 mg | DELAYED_RELEASE_TABLET | Freq: Two times a day (BID) | ORAL | 0 refills | Status: DC

## 2017-06-29 NOTE — Telephone Encounter (Signed)
Refill request for diclofenac. Dr. Paulla Dolly states refill once, pt needs an appt if continuing to have problems.

## 2017-08-04 ENCOUNTER — Telehealth: Payer: Self-pay | Admitting: *Deleted

## 2017-08-04 NOTE — Telephone Encounter (Addendum)
Refill request for diclofenac. Pt needs an appt prior to future refill. Return fax denying.

## 2017-08-07 ENCOUNTER — Other Ambulatory Visit: Payer: Self-pay | Admitting: *Deleted

## 2017-08-07 MED ORDER — DICLOFENAC SODIUM 75 MG PO TBEC: 75.0000 mg | DELAYED_RELEASE_TABLET | Freq: Two times a day (BID) | ORAL | 0 refills | Status: DC

## 2017-08-07 NOTE — Telephone Encounter (Signed)
Request for diclofenac refill-  Will refill 1 month supply but pt will need follow up before any other refills given-initial Rx was written 07-31-16

## 2017-09-14 ENCOUNTER — Telehealth: Payer: Self-pay | Admitting: Podiatry

## 2017-09-14 MED ORDER — DICLOFENAC SODIUM 75 MG PO TBEC: 75.0000 mg | DELAYED_RELEASE_TABLET | Freq: Two times a day (BID) | ORAL | 0 refills | Status: DC

## 2017-09-14 NOTE — Telephone Encounter (Signed)
Calling to get a refill on the diclofenac 75 mg tablets.

## 2017-09-14 NOTE — Addendum Note (Signed)
Addended by: Harriett Sine D on: 09/14/2017 11:51 AM   Modules accepted: Orders

## 2017-09-14 NOTE — Telephone Encounter (Signed)
Left message informing pt's wife, Sharyn Lull the diclofenac rx had been filled for 1/2 a prescription, because pt needs an appt.Marland Kitchen

## 2017-09-24 ENCOUNTER — Ambulatory Visit (INDEPENDENT_AMBULATORY_CARE_PROVIDER_SITE_OTHER): Payer: BLUE CROSS/BLUE SHIELD | Admitting: Podiatry

## 2017-09-24 ENCOUNTER — Encounter: Payer: Self-pay | Admitting: Podiatry

## 2017-09-24 DIAGNOSIS — L84 Corns and callosities: Secondary | ICD-10-CM

## 2017-09-24 DIAGNOSIS — M545 Low back pain: Secondary | ICD-10-CM | POA: Diagnosis not present

## 2017-09-24 DIAGNOSIS — Z6833 Body mass index (BMI) 33.0-33.9, adult: Secondary | ICD-10-CM | POA: Diagnosis not present

## 2017-09-24 DIAGNOSIS — M779 Enthesopathy, unspecified: Secondary | ICD-10-CM

## 2017-09-24 DIAGNOSIS — M5410 Radiculopathy, site unspecified: Secondary | ICD-10-CM | POA: Diagnosis not present

## 2017-09-24 DIAGNOSIS — Z1389 Encounter for screening for other disorder: Secondary | ICD-10-CM | POA: Diagnosis not present

## 2017-09-24 DIAGNOSIS — E6609 Other obesity due to excess calories: Secondary | ICD-10-CM | POA: Diagnosis not present

## 2017-09-24 MED ORDER — TRIAMCINOLONE ACETONIDE 10 MG/ML IJ SUSP
10.0000 mg | Freq: Once | INTRAMUSCULAR | Status: AC
  Administered 2017-09-24: 10 mg

## 2017-09-24 NOTE — Progress Notes (Signed)
Subjective:   Patient ID: Hunter Ray, male   DOB: 57 y.o.   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  Patient will decide what is best for him in his work schedule and will be seen back as needed

## 2017-10-14 ENCOUNTER — Telehealth: Payer: Self-pay | Admitting: *Deleted

## 2017-10-14 MED ORDER — DICLOFENAC SODIUM 75 MG PO TBEC: 75.0000 mg | DELAYED_RELEASE_TABLET | Freq: Two times a day (BID) | ORAL | 0 refills | Status: DC

## 2017-10-14 NOTE — Telephone Encounter (Signed)
Refill request for diclofenac. Dr. Paulla Dolly states refill as previously, pt needs an appt if continues to have problem.

## 2017-10-26 DIAGNOSIS — R61 Generalized hyperhidrosis: Secondary | ICD-10-CM | POA: Diagnosis not present

## 2017-10-26 DIAGNOSIS — R0602 Shortness of breath: Secondary | ICD-10-CM | POA: Diagnosis not present

## 2017-10-26 DIAGNOSIS — R509 Fever, unspecified: Secondary | ICD-10-CM | POA: Diagnosis not present

## 2017-10-26 DIAGNOSIS — R072 Precordial pain: Secondary | ICD-10-CM | POA: Diagnosis not present

## 2017-10-26 DIAGNOSIS — R5383 Other fatigue: Secondary | ICD-10-CM | POA: Diagnosis not present

## 2017-10-26 DIAGNOSIS — R51 Headache: Secondary | ICD-10-CM | POA: Diagnosis not present

## 2017-10-26 DIAGNOSIS — R079 Chest pain, unspecified: Secondary | ICD-10-CM | POA: Diagnosis not present

## 2017-10-26 DIAGNOSIS — M542 Cervicalgia: Secondary | ICD-10-CM | POA: Diagnosis not present

## 2017-10-27 DIAGNOSIS — I440 Atrioventricular block, first degree: Secondary | ICD-10-CM | POA: Diagnosis not present

## 2017-10-29 ENCOUNTER — Ambulatory Visit (INDEPENDENT_AMBULATORY_CARE_PROVIDER_SITE_OTHER): Payer: BLUE CROSS/BLUE SHIELD

## 2017-10-29 ENCOUNTER — Ambulatory Visit (HOSPITAL_COMMUNITY)
Admission: EM | Admit: 2017-10-29 | Discharge: 2017-10-29 | Disposition: A | Discharge status: Home / Self Care | Payer: BLUE CROSS/BLUE SHIELD | Attending: Family Medicine | Admitting: Family Medicine

## 2017-10-29 ENCOUNTER — Encounter (HOSPITAL_COMMUNITY): Payer: Self-pay | Admitting: Emergency Medicine

## 2017-10-29 DIAGNOSIS — R109 Unspecified abdominal pain: Secondary | ICD-10-CM | POA: Diagnosis not present

## 2017-10-29 DIAGNOSIS — K859 Acute pancreatitis without necrosis or infection, unspecified: Secondary | ICD-10-CM | POA: Insufficient documentation

## 2017-10-29 DIAGNOSIS — K861 Other chronic pancreatitis: Secondary | ICD-10-CM | POA: Diagnosis not present

## 2017-10-29 DIAGNOSIS — Z79899 Other long term (current) drug therapy: Secondary | ICD-10-CM | POA: Diagnosis not present

## 2017-10-29 DIAGNOSIS — R202 Paresthesia of skin: Secondary | ICD-10-CM

## 2017-10-29 DIAGNOSIS — I10 Essential (primary) hypertension: Secondary | ICD-10-CM | POA: Insufficient documentation

## 2017-10-29 DIAGNOSIS — Z981 Arthrodesis status: Secondary | ICD-10-CM | POA: Diagnosis not present

## 2017-10-29 DIAGNOSIS — M542 Cervicalgia: Secondary | ICD-10-CM | POA: Diagnosis not present

## 2017-10-29 DIAGNOSIS — M546 Pain in thoracic spine: Secondary | ICD-10-CM

## 2017-10-29 DIAGNOSIS — K8681 Exocrine pancreatic insufficiency: Secondary | ICD-10-CM | POA: Diagnosis not present

## 2017-10-29 DIAGNOSIS — D649 Anemia, unspecified: Secondary | ICD-10-CM | POA: Diagnosis not present

## 2017-10-29 DIAGNOSIS — M109 Gout, unspecified: Secondary | ICD-10-CM | POA: Insufficient documentation

## 2017-10-29 DIAGNOSIS — M79644 Pain in right finger(s): Secondary | ICD-10-CM | POA: Diagnosis not present

## 2017-10-29 DIAGNOSIS — R51 Headache: Secondary | ICD-10-CM | POA: Diagnosis not present

## 2017-10-29 DIAGNOSIS — R200 Anesthesia of skin: Secondary | ICD-10-CM | POA: Diagnosis not present

## 2017-10-29 DIAGNOSIS — R519 Headache, unspecified: Secondary | ICD-10-CM

## 2017-10-29 LAB — POCT URINALYSIS DIP (DEVICE)
Bilirubin Urine: NEGATIVE
GLUCOSE, UA: NEGATIVE mg/dL
Hgb urine dipstick: NEGATIVE
Ketones, ur: NEGATIVE mg/dL
Nitrite: NEGATIVE
PH: 5 (ref 5.0–8.0)
PROTEIN: NEGATIVE mg/dL
Specific Gravity, Urine: 1.015 (ref 1.005–1.030)
UROBILINOGEN UA: 0.2 mg/dL (ref 0.0–1.0)

## 2017-10-29 MED ORDER — CYCLOBENZAPRINE HCL 10 MG PO TABS: 10.0000 mg | ORAL_TABLET | Freq: Two times a day (BID) | ORAL | 0 refills | Status: DC | PRN

## 2017-10-29 MED ORDER — PREDNISONE 50 MG PO TABS: 50.0000 mg | ORAL_TABLET | Freq: Every day | ORAL | 0 refills | Status: AC

## 2017-10-29 NOTE — ED Triage Notes (Signed)
Provider triage  
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
## 2017-10-29 NOTE — Discharge Instructions (Signed)
Finger numbness:  Numbness likely from nerve irritation- Begin prednisone daily with food to help with this; unclear cause of this, cervical xray similar to previous xrays  Back Pain:  The prednisone should help relieve some discomfort with this as well He may also use Flexeril which is a muscle relaxer, begin taking half a tablet, his will cause some sleepiness, please do not use at work or when you need to drive, refrain used to at home or at bedtime You may alternate ice and heat to help loosen muscles as well as decrease any inflammation and swelling  Facial pain: This sounds like trigeminal neuralgia, please read attached information, please follow-up with your primary care for further evaluation and management if symptoms persisting or worsening  Please follow-up if symptoms worsening, persisting, not improving with treatment, developing shortness of breath, chest pain, weakness, difficulty moving arm, difficulty breathing, worsening dizziness

## 2017-10-30 ENCOUNTER — Emergency Department (HOSPITAL_COMMUNITY): Payer: BLUE CROSS/BLUE SHIELD

## 2017-10-30 ENCOUNTER — Emergency Department (HOSPITAL_COMMUNITY)
Admission: EM | Admit: 2017-10-30 | Discharge: 2017-10-30 | Disposition: A | Discharge status: Home / Self Care | Payer: BLUE CROSS/BLUE SHIELD | Attending: Emergency Medicine | Admitting: Emergency Medicine

## 2017-10-30 ENCOUNTER — Encounter (HOSPITAL_COMMUNITY): Payer: Self-pay

## 2017-10-30 ENCOUNTER — Other Ambulatory Visit: Payer: Self-pay

## 2017-10-30 DIAGNOSIS — Z79899 Other long term (current) drug therapy: Secondary | ICD-10-CM | POA: Insufficient documentation

## 2017-10-30 DIAGNOSIS — R1084 Generalized abdominal pain: Secondary | ICD-10-CM | POA: Insufficient documentation

## 2017-10-30 DIAGNOSIS — R200 Anesthesia of skin: Secondary | ICD-10-CM | POA: Insufficient documentation

## 2017-10-30 DIAGNOSIS — I10 Essential (primary) hypertension: Secondary | ICD-10-CM | POA: Insufficient documentation

## 2017-10-30 DIAGNOSIS — R51 Headache: Secondary | ICD-10-CM | POA: Diagnosis not present

## 2017-10-30 DIAGNOSIS — R0789 Other chest pain: Secondary | ICD-10-CM | POA: Diagnosis not present

## 2017-10-30 DIAGNOSIS — R0602 Shortness of breath: Secondary | ICD-10-CM | POA: Diagnosis not present

## 2017-10-30 DIAGNOSIS — R079 Chest pain, unspecified: Secondary | ICD-10-CM | POA: Diagnosis not present

## 2017-10-30 LAB — HEPATIC FUNCTION PANEL
ALBUMIN: 4.2 g/dL (ref 3.5–5.0)
ALT: 65 U/L — ABNORMAL HIGH (ref 0–44)
AST: 61 U/L — AB (ref 15–41)
Alkaline Phosphatase: 67 U/L (ref 38–126)
Bilirubin, Direct: 0.3 mg/dL — ABNORMAL HIGH (ref 0.0–0.2)
Indirect Bilirubin: 0.7 mg/dL (ref 0.3–0.9)
TOTAL PROTEIN: 7.3 g/dL (ref 6.5–8.1)
Total Bilirubin: 1 mg/dL (ref 0.3–1.2)

## 2017-10-30 LAB — URINALYSIS, ROUTINE W REFLEX MICROSCOPIC
Bilirubin Urine: NEGATIVE
Glucose, UA: NEGATIVE mg/dL
Hgb urine dipstick: NEGATIVE
Ketones, ur: NEGATIVE mg/dL
Leukocytes, UA: NEGATIVE
Nitrite: NEGATIVE
PROTEIN: NEGATIVE mg/dL
Specific Gravity, Urine: 1.025 (ref 1.005–1.030)
pH: 5 (ref 5.0–8.0)

## 2017-10-30 LAB — BASIC METABOLIC PANEL
Anion gap: 9 (ref 5–15)
BUN: 24 mg/dL — ABNORMAL HIGH (ref 6–20)
CHLORIDE: 107 mmol/L (ref 98–111)
CO2: 21 mmol/L — ABNORMAL LOW (ref 22–32)
Calcium: 10 mg/dL (ref 8.9–10.3)
Creatinine, Ser: 1.26 mg/dL — ABNORMAL HIGH (ref 0.61–1.24)
GFR calc Af Amer: >60 mL/min (ref >60)
GLUCOSE: 136 mg/dL — AB (ref 70–99)
POTASSIUM: 5 mmol/L (ref 3.5–5.1)
SODIUM: 137 mmol/L (ref 135–145)

## 2017-10-30 LAB — CBC
HEMATOCRIT: 41.8 % (ref 39.0–52.0)
Hemoglobin: 14.8 g/dL (ref 13.0–17.0)
MCH: 30.8 pg (ref 26.0–34.0)
MCHC: 35.4 g/dL (ref 30.0–36.0)
MCV: 87.1 fL (ref 78.0–100.0)
Platelets: 201 10*3/uL (ref 150–400)
RBC: 4.8 MIL/uL (ref 4.22–5.81)
RDW: 12.9 % (ref 11.5–15.5)
WBC: 7.8 10*3/uL (ref 4.0–10.5)

## 2017-10-30 LAB — I-STAT TROPONIN, ED
Troponin i, poc: 0.03 ng/mL (ref 0.00–0.08)
Troponin i, poc: 0.03 ng/mL (ref 0.00–0.08)

## 2017-10-30 LAB — URINE CULTURE

## 2017-10-30 LAB — LIPASE, BLOOD: Lipase: 53 U/L — ABNORMAL HIGH (ref 11–51)

## 2017-10-30 MED ORDER — SODIUM CHLORIDE 0.9 % IV BOLUS
1000.0000 mL | Freq: Once | INTRAVENOUS | Status: AC
  Administered 2017-10-30: 1000 mL via INTRAVENOUS

## 2017-10-30 MED ORDER — IOPAMIDOL (ISOVUE-370) INJECTION 76%
100.0000 mL | Freq: Once | INTRAVENOUS | Status: AC | PRN
  Administered 2017-10-30: 100 mL via INTRAVENOUS

## 2017-10-30 MED ORDER — METOCLOPRAMIDE HCL 5 MG/ML IJ SOLN
10.0000 mg | Freq: Once | INTRAMUSCULAR | Status: AC
  Administered 2017-10-30: 10 mg via INTRAVENOUS
  Filled 2017-10-30: qty 2

## 2017-10-30 MED ORDER — IOPAMIDOL (ISOVUE-370) INJECTION 76%
INTRAVENOUS | Status: AC
  Filled 2017-10-30: qty 100

## 2017-10-30 MED ORDER — TRAMADOL HCL 50 MG PO TABS: 50.0000 mg | ORAL_TABLET | Freq: Four times a day (QID) | ORAL | 0 refills | Status: DC | PRN

## 2017-10-30 MED ORDER — DIPHENHYDRAMINE HCL 50 MG/ML IJ SOLN
25.0000 mg | Freq: Once | INTRAMUSCULAR | Status: AC
  Administered 2017-10-30: 25 mg via INTRAVENOUS
  Filled 2017-10-30: qty 1

## 2017-10-30 MED ORDER — MORPHINE SULFATE (PF) 4 MG/ML IV SOLN
4.0000 mg | Freq: Once | INTRAVENOUS | Status: AC
  Administered 2017-10-30: 4 mg via INTRAVENOUS
  Filled 2017-10-30 (×2): qty 1

## 2017-10-30 NOTE — ED Notes (Signed)
IV attempted x2 without success.

## 2017-10-30 NOTE — Discharge Instructions (Addendum)
Take tramadol for pain.   Continue your prednisone and flexeril.   See ortho for your arm numbness, cardiology for follow up for chest pain   Return to ER if you have worse chest pain, arm numbness, trouble breathing, headaches.

## 2017-10-30 NOTE — ED Triage Notes (Addendum)
Patient c/o intermittent mid chest pain and SOB x 1 week. paient was seen at [facility] yesterday.  Patient also c/o right flank pain and "shooting " headache on the right side.
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
## 2017-10-31 LAB — URINE CULTURE

## 2017-11-02 ENCOUNTER — Telehealth (HOSPITAL_COMMUNITY): Payer: Self-pay

## 2017-11-02 NOTE — Telephone Encounter (Signed)
Urine culture positive for Viridans strep. Per [facility] if patient is symptomatic the patient needs to start Keflex 500 mg bid x 5 days. Attempted to reach patient. No answer at this time.

## 2017-11-03 ENCOUNTER — Telehealth (HOSPITAL_COMMUNITY): Payer: Self-pay

## 2017-11-03 NOTE — Telephone Encounter (Signed)
No answer x2 

## 2017-11-05 ENCOUNTER — Telehealth (HOSPITAL_COMMUNITY): Payer: Self-pay

## 2017-11-05 NOTE — Telephone Encounter (Signed)
No answer x 3. Letter sent. 

## 2017-11-12 DIAGNOSIS — R0789 Other chest pain: Secondary | ICD-10-CM | POA: Diagnosis not present

## 2017-11-12 DIAGNOSIS — Z6833 Body mass index (BMI) 33.0-33.9, adult: Secondary | ICD-10-CM | POA: Diagnosis not present

## 2017-11-12 DIAGNOSIS — Z1389 Encounter for screening for other disorder: Secondary | ICD-10-CM | POA: Diagnosis not present

## 2017-11-12 DIAGNOSIS — E6609 Other obesity due to excess calories: Secondary | ICD-10-CM | POA: Diagnosis not present

## 2017-11-19 ENCOUNTER — Ambulatory Visit (INDEPENDENT_AMBULATORY_CARE_PROVIDER_SITE_OTHER): Payer: Self-pay

## 2017-11-19 DIAGNOSIS — Z86010 Personal history of colonic polyps: Secondary | ICD-10-CM

## 2017-11-19 MED ORDER — NA SULFATE-K SULFATE-MG SULF 17.5-3.13-1.6 GM/177ML PO SOLN: 1.0000 | ORAL | 0 refills | Status: DC

## 2017-11-19 NOTE — Progress Notes (Signed)
Gastroenterology Pre-Procedure Review  Request Date:11/19/17 Requesting Physician: 7 year recall last tcs 01/02/11 RMR- tubular adenoma  PATIENT REVIEW QUESTIONS: The patient responded to the following health history questions as indicated:    1. Diabetes Melitis: no 2. Joint replacements in the past 12 months: no 3. Major health problems in the past 3 months: yes (went to ED 1 month ago with chest pain, pt stated they couldn't find anything wrong with him. He is not having chest pain now.) 4. Has an artificial valve or MVP: no 5. Has a defibrillator: no 6. Has been advised in past to take antibiotics in advance of a procedure like teeth cleaning: no 7. Family history of colon cancer: no,  8. Alcohol Use: no 9. History of sleep apnea: no  10. History of coronary artery or other vascular stents placed within the last 12 months: no 11. History of any prior anesthesia complications: no    MEDICATIONS & ALLERGIES:    Patient reports the following regarding taking any blood thinners:   Plavix? no Aspirin? no Coumadin? no Brilinta? no Xarelto? no Eliquis? no Pradaxa? no Savaysa? no Effient? no  Patient confirms/reports the following medications:  Current Outpatient Medications  Medication Sig Dispense Refill  . colchicine 0.6 MG tablet Take 0.6 mg by mouth daily. Gout flare-ups    . diclofenac (VOLTAREN) 75 MG EC tablet Take 1 tablet (75 mg total) by mouth 2 (two) times daily. 30 tablet 0  . olmesartan (BENICAR) 40 MG tablet Take 40 mg by mouth daily.      Marland Kitchen allopurinol (ZYLOPRIM) 300 MG tablet Take 1 tablet (300 mg total) by mouth daily. (Patient not taking: Reported on 10/30/2017) 30 tablet 0   No current facility-administered medications for this visit.     Patient confirms/reports the following allergies:  No Known Allergies  No orders of the defined types were placed in this encounter.   AUTHORIZATION INFORMATION Primary Insurance: [place],  [place] #: [id number] Pre-Cert  / Josem Kaufmann required: no   SCHEDULE INFORMATION: Procedure has been scheduled as follows:  Date: , Time: 7:30 Location: [facility] Dr[facility]  This Gastroenterology Pre-Precedure Review Form is being routed to the following provider(s): Walden Field NP

## 2017-11-19 NOTE — Patient Instructions (Signed)
Hunter Ray  [birth year] MRN: [id number]     Procedure Date: 01/20/18 Time to register: 6:30am Place to register: [facility] Procedure Time: 7:30am Scheduled provider: R. Garfield Cornea, MD    PREPARATION FOR COLONOSCOPY WITH SUPREP BOWEL PREP KIT  Note: Suprep Bowel Prep Kit is a split-dose (2day) regimen. Consumption of BOTH 6-ounce bottles is required for a complete prep.  Please notify us immediately if you are diabetic, take iron supplements, or if you are on Coumadin or any other blood thinners.                                                                                                                                                  2 DAYS BEFORE PROCEDURE:  DATE: 01/18/18   DAY: Monday Begin clear liquid diet AFTER your lunch meal. NO SOLID FOODS after this point.  1 DAY BEFORE PROCEDURE:  DATE: 01/19/18   DAY: Tuesday Continue clear liquids the entire day - NO SOLID FOOD.   At 6:00pm: Complete steps 1 through 4 below, using ONE (1) 6-ounce bottle, before going to bed. Step 1:  Pour ONE (1) 6-ounce bottle of SUPREP liquid into the mixing container.  Step 2:  Add cool drinking water to the 16 ounce line on the container and mix.  Note: Dilute the solution concentrate as directed prior to use. Step 3:  DRINK ALL the liquid in the container. Step 4:  You MUST drink an additional two (2) or more 16 ounce containers of water over the next one (1) hour.   Continue clear liquids.  DAY OF PROCEDURE:   DATE: 01/20/18  DAY: Wednesday If you take medications for your heart, blood pressure, or breathing, you may take these medications.   5 hours before your procedure at : 2:30am Step 1:  Pour ONE (1) 6-ounce bottle of SUPREP liquid into the mixing container.  Step 2:  Add cool drinking water to the 16 ounce line on the container and mix.  Note: Dilute the solution concentrate as directed prior to use. Step 3:  DRINK ALL the liquid in the container. Step 4:   You MUST drink an additional two (2) or more 16 ounce containers of water over the next one (1) hour. You MUST complete the final glass of water at least 3 hours before your colonoscopy.   Nothing by mouth past 4:30am  You may take your morning medications with sip of water unless we have instructed otherwise.    Please see below for Dietary Information.  CLEAR LIQUIDS INCLUDE:  Water Jello (NOT red in color)   Ice Popsicles (NOT red in color)   Tea (sugar ok, no milk/cream) Powdered fruit flavored drinks  Coffee (sugar ok, no milk/cream) Gatorade/ Lemonade/ Kool-Aid  (NOT red in color)   Juice: apple, white grape, white cranberry Soft drinks  Clear bullion, consomme, broth (fat free beef/chicken/vegetable)  Carbonated beverages (any kind)  Strained chicken noodle soup Hard Candy   Remember: Clear liquids are liquids that will allow you to see your fingers on the other side of a clear glass. Be sure liquids are NOT red in color, and not cloudy, but CLEAR.  DO NOT EAT OR DRINK ANY OF THE FOLLOWING:  Dairy products of any kind   Cranberry juice Tomato juice / V8 juice   Grapefruit juice Orange juice     Red grape juice  Do not eat any solid foods, including such foods as: cereal, oatmeal, yogurt, fruits, vegetables, creamed soups, eggs, bread, crackers, pureed foods in a blender, etc.   HELPFUL HINTS FOR DRINKING PREP SOLUTION:   Make sure prep is extremely cold. Mix and refrigerate the the morning of the prep. You may also put in the freezer.   You may try mixing some Crystal Light or Country Time Lemonade if you prefer. Mix in small amounts; add more if necessary.  Try drinking through a straw  Rinse mouth with water or a mouthwash between glasses, to remove after-taste.  Try sipping on a cold beverage /ice/ popsicles between glasses of prep.  Place a piece of sugar-free hard candy in mouth between glasses.  If you become nauseated, try consuming smaller amounts, or stretch  out the time between glasses. Stop for 30-60 minutes, then slowly start back drinking.     OTHER INSTRUCTIONS  You will need a responsible adult at least 55 years of age to accompany you and drive you home. This person must remain in the waiting room during your procedure. The hospital will cancel your procedure if you do not have a responsible adult with you.   1. Wear loose fitting clothing that is easily removed. 2. Leave jewelry and other valuables at home.  3. Remove all body piercing jewelry and leave at home. 4. Total time from sign-in until discharge is approximately 2-3 hours. 5. You should go home directly after your procedure and rest. You can resume normal activities the day after your procedure. 6. The day of your procedure you should not:  Drive  Make legal decisions  Operate machinery  Drink alcohol  Return to work   You may call the office (Dept: [phone]) before 5:00pm, or page the doctor on call ([phone]) after 5:00pm, for further instructions, if necessary.   Insurance Information YOU WILL NEED TO CHECK WITH YOUR INSURANCE COMPANY FOR THE BENEFITS OF COVERAGE YOU HAVE FOR THIS PROCEDURE.  UNFORTUNATELY, NOT ALL INSURANCE COMPANIES HAVE BENEFITS TO COVER ALL OR PART OF THESE TYPES OF PROCEDURES.  IT IS YOUR RESPONSIBILITY TO CHECK YOUR BENEFITS, HOWEVER, WE WILL BE GLAD TO ASSIST YOU WITH ANY CODES YOUR INSURANCE COMPANY MAY NEED.    PLEASE NOTE THAT MOST INSURANCE COMPANIES WILL NOT COVER A SCREENING COLONOSCOPY FOR PEOPLE UNDER THE AGE OF 50  IF YOU HAVE BCBS INSURANCE, YOU MAY HAVE BENEFITS FOR A SCREENING COLONOSCOPY BUT IF POLYPS ARE FOUND THE DIAGNOSIS WILL CHANGE AND THEN YOU MAY HAVE A DEDUCTIBLE THAT WILL NEED TO BE MET. SO PLEASE MAKE SURE YOU CHECK YOUR BENEFITS FOR A SCREENING COLONOSCOPY AS WELL AS A DIAGNOSTIC COLONOSCOPY.      

## 2017-11-20 NOTE — Progress Notes (Signed)
Ok to schedule.

## 2017-11-26 NOTE — Addendum Note (Signed)
Addended by: Claudina Lick on: 11/26/2017 10:25 AM   Modules accepted: Orders, SmartSet

## 2017-11-27 ENCOUNTER — Encounter: Payer: Self-pay | Admitting: Internal Medicine

## 2017-12-01 ENCOUNTER — Encounter: Payer: Self-pay | Admitting: Cardiovascular Disease

## 2018-01-20 ENCOUNTER — Encounter (HOSPITAL_COMMUNITY)
Admission: RE | Disposition: A | Discharge status: Home / Self Care | Payer: Self-pay | Source: Ambulatory Visit | Attending: Internal Medicine

## 2018-01-20 ENCOUNTER — Ambulatory Visit (HOSPITAL_COMMUNITY)
Admission: RE | Admit: 2018-01-20 | Discharge: 2018-01-20 | Disposition: A | Discharge status: Home / Self Care | Payer: BLUE CROSS/BLUE SHIELD | Source: Ambulatory Visit | Attending: Internal Medicine | Admitting: Internal Medicine

## 2018-01-20 ENCOUNTER — Other Ambulatory Visit: Payer: Self-pay

## 2018-01-20 ENCOUNTER — Encounter (HOSPITAL_COMMUNITY): Payer: Self-pay | Admitting: *Deleted

## 2018-01-20 DIAGNOSIS — M109 Gout, unspecified: Secondary | ICD-10-CM | POA: Diagnosis not present

## 2018-01-20 DIAGNOSIS — I10 Essential (primary) hypertension: Secondary | ICD-10-CM | POA: Diagnosis not present

## 2018-01-20 DIAGNOSIS — Z86010 Personal history of colonic polyps: Secondary | ICD-10-CM | POA: Diagnosis not present

## 2018-01-20 DIAGNOSIS — Z86718 Personal history of other venous thrombosis and embolism: Secondary | ICD-10-CM | POA: Insufficient documentation

## 2018-01-20 DIAGNOSIS — Z1211 Encounter for screening for malignant neoplasm of colon: Secondary | ICD-10-CM | POA: Diagnosis not present

## 2018-01-20 HISTORY — PX: COLONOSCOPY: SHX5424

## 2018-01-20 SURGERY — COLONOSCOPY
Anesthesia: Moderate Sedation

## 2018-01-20 MED ORDER — ONDANSETRON HCL 4 MG/2ML IJ SOLN
INTRAMUSCULAR | Status: DC | PRN
  Administered 2018-01-20: 4 mg via INTRAVENOUS

## 2018-01-20 MED ORDER — SODIUM CHLORIDE 0.9 % IV SOLN
INTRAVENOUS | Status: DC
  Administered 2018-01-20: 07:00:00 via INTRAVENOUS

## 2018-01-20 MED ORDER — MIDAZOLAM HCL 5 MG/5ML IJ SOLN
INTRAMUSCULAR | Status: DC | PRN
  Administered 2018-01-20 (×2): 1 mg via INTRAVENOUS
  Administered 2018-01-20: 2 mg via INTRAVENOUS

## 2018-01-20 MED ORDER — MEPERIDINE HCL 50 MG/ML IJ SOLN
INTRAMUSCULAR | Status: AC
  Filled 2018-01-20: qty 1

## 2018-01-20 MED ORDER — MIDAZOLAM HCL 5 MG/5ML IJ SOLN
INTRAMUSCULAR | Status: AC
  Filled 2018-01-20: qty 10

## 2018-01-20 MED ORDER — STERILE WATER FOR IRRIGATION IR SOLN
Status: DC | PRN
  Administered 2018-01-20: 1.5 mL

## 2018-01-20 MED ORDER — ONDANSETRON HCL 4 MG/2ML IJ SOLN
INTRAMUSCULAR | Status: AC
  Filled 2018-01-20: qty 2

## 2018-01-20 MED ORDER — MEPERIDINE HCL 100 MG/ML IJ SOLN
INTRAMUSCULAR | Status: DC | PRN
  Administered 2018-01-20: 25 mg

## 2018-01-20 NOTE — Discharge Instructions (Signed)
°  Colonoscopy Discharge Instructions  Read the instructions outlined below and refer to this sheet in the next few weeks. These discharge instructions provide you with general information on caring for yourself after you leave the hospital. Your doctor may also give you specific instructions. While your treatment has been planned according to the most current medical practices available, unavoidable complications occasionally occur. If you have any problems or questions after discharge, call Dr. Gala Romney at [phone]. ACTIVITY  You may resume your regular activity, but move at a slower pace for the next 24 hours.   Take frequent rest periods for the next 24 hours.   Walking will help get rid of the air and reduce the bloated feeling in your belly (abdomen).   No driving for 24 hours (because of the medicine (anesthesia) used during the test).    Do not sign any important legal documents or operate any machinery for 24 hours (because of the anesthesia used during the test).  NUTRITION  Drink plenty of fluids.   You may resume your normal diet as instructed by your doctor.   Begin with a light meal and progress to your normal diet. Heavy or fried foods are harder to digest and may make you feel sick to your stomach (nauseated).   Avoid alcoholic beverages for 24 hours or as instructed.  MEDICATIONS  You may resume your normal medications unless your doctor tells you otherwise.  WHAT YOU CAN EXPECT TODAY  Some feelings of bloating in the abdomen.   Passage of more gas than usual.   Spotting of blood in your stool or on the toilet paper.  IF YOU HAD POLYPS REMOVED DURING THE COLONOSCOPY:  No aspirin products for 7 days or as instructed.   No alcohol for 7 days or as instructed.   Eat a soft diet for the next 24 hours.  FINDING OUT THE RESULTS OF YOUR TEST Not all test results are available during your visit. If your test results are not back during the visit, make an appointment  with your caregiver to find out the results. Do not assume everything is normal if you have not heard from your caregiver or the medical facility. It is important for you to follow up on all of your test results.  SEEK IMMEDIATE MEDICAL ATTENTION IF:  You have more than a spotting of blood in your stool.   Your belly is swollen (abdominal distention).   You are nauseated or vomiting.   You have a temperature over 101.   You have abdominal pain or discomfort that is severe or gets worse throughout the day.    Your colonoscopy was normal today  I recommend you have a repeat colonoscopy in 5 years.
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
## 2018-01-21 NOTE — Op Note (Signed)
[facility] Patient Name: Hunter Ray Procedure Date: 01/20/2018 7:29 AM MRN: [id number] Date of Birth: 1960-08-02 Attending MD: Norvel Richards , MD CSN: [id number] Age: 58 Admit Type: Outpatient Procedure:                Colonoscopy Indications:              High risk colon cancer surveillance: Personal                            history of colonic polyps Providers:                Norvel Richards, MD, Lurline Del, RN, Gerome Sam, RN Referring MD:              Medicines:                Midazolam 4 mg IV, Meperidine 25 mg IV Complications:            No immediate complications. Estimated Blood Loss:     Estimated blood loss: none. Procedure:                Pre-Anesthesia Assessment:                           - Prior to the procedure, a History and Physical                            was performed, and patient medications and                            allergies were reviewed. The patient's tolerance of                            previous anesthesia was also reviewed. The risks                            and benefits of the procedure and the sedation                            options and risks were discussed with the patient.                            All questions were answered, and informed consent                            was obtained. Prior Anticoagulants: The patient has                            taken no previous anticoagulant or antiplatelet                            agents. ASA Grade Assessment: II - A patient with  mild systemic disease. After reviewing the risks                            and benefits, the patient was deemed in                            satisfactory condition to undergo the procedure.                           After obtaining informed consent, the colonoscope                            was passed under direct vision. Throughout the                            procedure, the  patient's blood pressure, pulse, and                            oxygen saturations were monitored continuously. The                            [id number] ([id number]) scope was introduced through                            the anus and advanced to the the ileocecal valve.                            The colonoscopy was performed without difficulty.                            The patient tolerated the procedure well. The                            quality of the bowel preparation was adequate. Scope In: 7:40:26 AM Scope Out: 7:52:19 AM Scope Withdrawal Time: 0 hours 9 minutes 26 seconds  Total Procedure Duration: 0 hours 11 minutes 53 seconds  Findings:      The perianal and digital rectal examinations were normal.      The entire examined colon appeared normal on direct and retroflexion       views. Impression:               - The entire examined colon is normal on direct and                            retroflexion views.                           - No specimens collected. Moderate Sedation:      Moderate (conscious) sedation was administered by the endoscopy nurse       and supervised by the endoscopist. The following parameters were       monitored: oxygen saturation, heart rate, blood pressure, respiratory       rate, EKG, adequacy of pulmonary ventilation, and response to care.       Total physician intraservice time was 16 minutes.  Recommendation:           - Patient has a contact number available for                            emergencies. The signs and symptoms of potential                            delayed complications were discussed with the                            patient. Return to normal activities tomorrow.                            Written discharge instructions were provided to the                            patient.                           - Advance diet as tolerated. Procedure Code(s):        --- Professional ---                           [phone], Colonoscopy,  flexible; diagnostic, including                            collection of specimen(s) by brushing or washing,                            when performed (separate procedure)                           G0500, Moderate sedation services provided by the                            same physician or other qualified health care                            professional performing a gastrointestinal                            endoscopic service that sedation supports,                            requiring the presence of an independent trained                            observer to assist in the monitoring of the                            patient's level of consciousness and physiological                            status; initial 15 minutes of intra-service time;  patient age 85 years or older (additional time may                            be reported with [phone], as appropriate) Diagnosis Code(s):        --- Professional ---                           Z86.010, Personal history of colonic polyps CPT copyright 2018 American Medical Association. All rights reserved. The codes documented in this report are preliminary and upon coder review may  be revised to meet current compliance requirements. [name], MD Norvel Richards, MD 01/20/2018 7:58:09 AM This report has been signed electronically. Number of Addenda: 0

## 2018-01-26 ENCOUNTER — Encounter (HOSPITAL_COMMUNITY): Payer: Self-pay | Admitting: Internal Medicine

## 2018-02-17 HISTORY — PX: TRANSTHORACIC ECHOCARDIOGRAM: SHX275

## 2018-02-25 ENCOUNTER — Encounter: Payer: Self-pay | Admitting: Cardiology

## 2018-02-25 ENCOUNTER — Ambulatory Visit (INDEPENDENT_AMBULATORY_CARE_PROVIDER_SITE_OTHER): Payer: BLUE CROSS/BLUE SHIELD | Admitting: Cardiology

## 2018-02-25 ENCOUNTER — Encounter

## 2018-02-25 VITALS — BP 132/90 | HR 81 | Ht 74.0 in | Wt 253.6 lb

## 2018-02-25 DIAGNOSIS — R0609 Other forms of dyspnea: Secondary | ICD-10-CM | POA: Diagnosis not present

## 2018-02-25 DIAGNOSIS — R9431 Abnormal electrocardiogram [ECG] [EKG]: Secondary | ICD-10-CM | POA: Insufficient documentation

## 2018-02-25 DIAGNOSIS — I10 Essential (primary) hypertension: Secondary | ICD-10-CM

## 2018-02-25 DIAGNOSIS — Z01818 Encounter for other preprocedural examination: Secondary | ICD-10-CM

## 2018-02-25 DIAGNOSIS — R079 Chest pain, unspecified: Secondary | ICD-10-CM | POA: Insufficient documentation

## 2018-02-25 MED ORDER — METOPROLOL TARTRATE 50 MG PO TABS: 100.0000 mg | ORAL_TABLET | Freq: Once | ORAL | 0 refills | Status: DC

## 2018-02-25 NOTE — Assessment & Plan Note (Signed)
I am a little worried about the fact that he was having some exertional chest pain off and on back in September and October.  Concerning that this could be related to CAD and even potential prior MI. Plan: We will check coronary CT angiogram to exclude ischemia. Depending on results, will need to consider adding beta-blocker and evaluate lipid panel along with starting statin.

## 2018-02-25 NOTE — Assessment & Plan Note (Signed)
Blood pressure is borderline with a diastolic of 90 mm.,  Not currently on anything besides ARB.  Pending results of CT evaluation would probably want to consider beta-blocker.

## 2018-02-25 NOTE — Assessment & Plan Note (Addendum)
This is been going on since September when he was having some chest pain that at that time was thought to musculoskeletal pain.  At this point, I am concerned that that could have been possible angina.  His EKG is suggestive of possible prior MI.  Plan:   Ischemic evaluation with coronary CT angiogram  Structural evaluation for EF and filling pressures with 2D echo.

## 2018-02-25 NOTE — Patient Instructions (Addendum)
Medication Instructions:   SEE INSTRUCTION SHEET FOR CCTA   If you need a refill on your cardiac medications before your next appointment, please call your pharmacy.   Lab work: SEE Designer, jewellery FOR CCTA   If you have labs (blood work) drawn today and your tests are completely normal, you will receive your results only by: Marland Kitchen MyChart Message (if you have MyChart) OR . A paper copy in the mail If you have any lab test that is abnormal or we need to change your treatment, we will call you to review the results.  Testing/Procedures:  SCHEDULE AT [facility] has requested that you have an echocardiogram. Echocardiography is a painless test that uses sound waves to create images of your heart. It provides your doctor with information about the size and shape of your heart and how well your heart's chambers and valves are working. This procedure takes approximately one hour. There are no restrictions for this procedure.   SCHEDULE AT [facility]. Your physician has requested that you have  CORONARY cardiac CTA. Cardiac computed tomography (CT) is a painless test that uses an x-ray machine to take clear, detailed pictures of your heart. For further information please visit [email or web address]. Please follow instruction sheet as given.     Follow-Up: At [facility], you and your health needs are our priority.  As part of our continuing mission to provide you with exceptional heart care, we have created designated Provider Care Teams.  These Care Teams include your primary Cardiologist (physician) and Advanced Practice Providers (APPs -  Physician Assistants and Nurse Practitioners) who all work together to provide you with the care you need, when you need it. . Your physician recommends that you schedule a follow-up appointment in 2 MONTHS WITH DR AHRDING .   Any Other Special Instructions Will Be Listed Below (If  Applicable).   INSTRUCTIONS FOR  CORONARY CTA    Please arrive at the [facility] main entrance of [facility] at (30-45 minutes prior to test start time)  [facility] [place], [place] [address] [phone]  Proceed to the [facility] Radiology Department (First Floor).  Please follow these instructions carefully (unless otherwise directed):  PLEASE HAVE LABS - BMP  AT LEAST ONE WEEK PRIOR TO TEST  On the Night Before the Test: . Drink plenty of water. . Do not consume any caffeinated/decaffeinated beverages or chocolate 12 hours prior to your test. . Do not take any antihistamines 12 hours prior to your test.    On the Day of the Test: . Drink plenty of water. Do not drink any water within one hour of the test. . Do not eat any food 4 hours prior to the test. . You may take your regular medications prior to the test. . Take 100 mg of lopressor (metoprolol) TWO hour before the test.   After the Test: . Drink plenty of water. . After receiving IV contrast, you may experience a mild flushed feeling. This is normal. . On occasion, you may experience a mild rash up to 24 hours after the test. This is not dangerous. If this occurs, you can take Benadryl 25 mg and increase your fluid intake. . If you experience trouble breathing, this can be serious. If it is severe call 911 IMMEDIATELY. If it is mild, please call our office.
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
## 2018-02-25 NOTE — Assessment & Plan Note (Signed)
This is an abnormal EKG suggesting possible anterior MI in a patient who has had 2 days of chest pain now followed by exertional dyspnea.  Need to exclude an ischemic etiology and potential prior MI. Plan: 2D echo and CT coronary angiogram.

## 2018-03-04 ENCOUNTER — Other Ambulatory Visit: Payer: Self-pay

## 2018-03-04 ENCOUNTER — Ambulatory Visit (HOSPITAL_COMMUNITY): Payer: BLUE CROSS/BLUE SHIELD | Attending: Cardiovascular Disease

## 2018-03-04 DIAGNOSIS — R9431 Abnormal electrocardiogram [ECG] [EKG]: Secondary | ICD-10-CM | POA: Diagnosis not present

## 2018-03-04 DIAGNOSIS — R0609 Other forms of dyspnea: Secondary | ICD-10-CM | POA: Insufficient documentation

## 2018-03-04 DIAGNOSIS — R079 Chest pain, unspecified: Secondary | ICD-10-CM

## 2018-03-04 MED ORDER — PERFLUTREN LIPID MICROSPHERE
1.0000 mL | INTRAVENOUS | Status: AC | PRN
  Administered 2018-03-04: 2 mL via INTRAVENOUS

## 2018-03-09 ENCOUNTER — Telehealth: Payer: Self-pay | Admitting: *Deleted

## 2018-03-09 NOTE — Telephone Encounter (Signed)
-----   Message from Leonie Man, MD sent at 03/04/2018  7:16 PM EST ----- Echo shows normal function.  It does show abnormal relaxation is grade 2 diastolic dysfunction.  This could easily be because of shortness of breath and is usually associated with high blood pressure. The valves look good.  There is no sign of prior heart attack.  Glenetta Hew, MD  pls fwd to PCP: Sharilyn Sites, MD

## 2018-03-09 NOTE — Telephone Encounter (Signed)
Follow up   Pt returning call to get results

## 2018-03-09 NOTE — Telephone Encounter (Signed)
Left message to call back To give results

## 2018-03-09 NOTE — Telephone Encounter (Signed)
The patient has been notified of the result and verbalized understanding.  All questions (if any) were answered. Raiford Simmonds, RN 03/09/2018 3:02 PM

## 2018-04-02 DIAGNOSIS — R0609 Other forms of dyspnea: Secondary | ICD-10-CM | POA: Diagnosis not present

## 2018-04-02 DIAGNOSIS — R9431 Abnormal electrocardiogram [ECG] [EKG]: Secondary | ICD-10-CM | POA: Diagnosis not present

## 2018-04-02 DIAGNOSIS — Z01818 Encounter for other preprocedural examination: Secondary | ICD-10-CM | POA: Diagnosis not present

## 2018-04-02 DIAGNOSIS — R079 Chest pain, unspecified: Secondary | ICD-10-CM | POA: Diagnosis not present

## 2018-04-03 LAB — BASIC METABOLIC PANEL
BUN/Creatinine Ratio: 14 (ref 9–20)
BUN: 16 mg/dL (ref 6–24)
CO2: 19 mmol/L — ABNORMAL LOW (ref 20–29)
CREATININE: 1.12 mg/dL (ref 0.76–1.27)
Calcium: 9.6 mg/dL (ref 8.7–10.2)
Chloride: 104 mmol/L (ref 96–106)
GFR calc Af Amer: 85 mL/min/{1.73_m2} (ref >59)
GFR calc non Af Amer: 74 mL/min/{1.73_m2} (ref >59)
Glucose: 86 mg/dL (ref 65–99)
Potassium: 4.6 mmol/L (ref 3.5–5.2)
Sodium: 136 mmol/L (ref 134–144)

## 2018-04-05 ENCOUNTER — Telehealth: Payer: Self-pay | Admitting: *Deleted

## 2018-04-05 NOTE — Telephone Encounter (Signed)
Left  Detail message of lab result ,per dpi-  Ok for ct scan --any question may call back

## 2018-04-05 NOTE — Telephone Encounter (Signed)
-----   Message from Leonie Man, MD sent at 04/04/2018 10:45 PM EST ----- Chemistry panel looks good.  Kidney function is notably improved from 5 months ago.  Okay for CT scan.  Glenetta Hew, MD

## 2018-04-06 ENCOUNTER — Telehealth (HOSPITAL_COMMUNITY): Payer: Self-pay | Admitting: Emergency Medicine

## 2018-04-06 NOTE — Telephone Encounter (Signed)
Pt returned phone call -- pt verbalizes understanding of appt date/time, parking situation and where to check in, pre-test NPO status and medications ordered, and verified current allergies; name and call back number provided for further questions should they arise Marchia Bond RN Navigator Cardiac Imaging [facility] [phone] office [phone] cell

## 2018-04-06 NOTE — Telephone Encounter (Signed)
Returning patients phone call from voicemail. Pt was unable to answer. Left another voicemail.   Hunter Ray

## 2018-04-06 NOTE — Telephone Encounter (Signed)
Left message on voicemail with name and callback number Dedee Liss RN Navigator Cardiac Imaging [facility] [phone] Office [phone] Cell  

## 2018-04-07 ENCOUNTER — Telehealth: Payer: Self-pay | Admitting: Cardiology

## 2018-04-07 MED ORDER — METOPROLOL TARTRATE 50 MG PO TABS: 100.0000 mg | ORAL_TABLET | Freq: Once | ORAL | 0 refills | Status: DC

## 2018-04-07 NOTE — Telephone Encounter (Signed)
Informed Pharmacy Rep that per chart review, pt is to take Metoprolol 50 mg 2 tablet (100 mg) 2 hours prior to test. Correct order sent to pharmacy.

## 2018-04-07 NOTE — Telephone Encounter (Signed)
New Message:     She needs to know if pt supposed to be taking 1 tablet or 2 tablets of his Metoprolol please?

## 2018-04-08 ENCOUNTER — Encounter: Payer: Self-pay | Admitting: Neurology

## 2018-04-08 ENCOUNTER — Encounter (HOSPITAL_COMMUNITY): Payer: Self-pay

## 2018-04-08 ENCOUNTER — Ambulatory Visit (HOSPITAL_COMMUNITY)
Admission: RE | Admit: 2018-04-08 | Discharge: 2018-04-08 | Disposition: A | Discharge status: Home / Self Care | Payer: BLUE CROSS/BLUE SHIELD | Source: Ambulatory Visit | Attending: Cardiology | Admitting: Cardiology

## 2018-04-08 ENCOUNTER — Ambulatory Visit (HOSPITAL_COMMUNITY): Admission: RE | Admit: 2018-04-08 | Payer: BLUE CROSS/BLUE SHIELD | Source: Ambulatory Visit

## 2018-04-08 ENCOUNTER — Ambulatory Visit (INDEPENDENT_AMBULATORY_CARE_PROVIDER_SITE_OTHER): Payer: BLUE CROSS/BLUE SHIELD | Admitting: Neurology

## 2018-04-08 VITALS — BP 125/85 | HR 80 | Ht 74.0 in | Wt 249.0 lb

## 2018-04-08 DIAGNOSIS — R9431 Abnormal electrocardiogram [ECG] [EKG]: Secondary | ICD-10-CM | POA: Diagnosis not present

## 2018-04-08 DIAGNOSIS — R0609 Other forms of dyspnea: Secondary | ICD-10-CM | POA: Diagnosis not present

## 2018-04-08 DIAGNOSIS — R202 Paresthesia of skin: Secondary | ICD-10-CM | POA: Diagnosis not present

## 2018-04-08 DIAGNOSIS — R200 Anesthesia of skin: Secondary | ICD-10-CM | POA: Diagnosis not present

## 2018-04-08 DIAGNOSIS — R29898 Other symptoms and signs involving the musculoskeletal system: Secondary | ICD-10-CM

## 2018-04-08 DIAGNOSIS — R079 Chest pain, unspecified: Secondary | ICD-10-CM | POA: Insufficient documentation

## 2018-04-08 DIAGNOSIS — I2510 Atherosclerotic heart disease of native coronary artery without angina pectoris: Secondary | ICD-10-CM

## 2018-04-08 HISTORY — DX: Atherosclerotic heart disease of native coronary artery without angina pectoris: I25.10

## 2018-04-08 MED ORDER — IOPAMIDOL (ISOVUE-370) INJECTION 76%
80.0000 mL | Freq: Once | INTRAVENOUS | Status: AC | PRN
  Administered 2018-04-08: 80 mL via INTRAVENOUS

## 2018-04-08 MED ORDER — NITROGLYCERIN 0.4 MG SL SUBL
0.8000 mg | SUBLINGUAL_TABLET | Freq: Once | SUBLINGUAL | Status: AC
  Administered 2018-04-08: 0.8 mg via SUBLINGUAL
  Filled 2018-04-08: qty 25

## 2018-04-08 MED ORDER — NITROGLYCERIN 0.4 MG SL SUBL
SUBLINGUAL_TABLET | SUBLINGUAL | Status: AC
  Filled 2018-04-08: qty 2

## 2018-04-08 NOTE — Patient Instructions (Signed)
Your strength is good, you don't have any numbness current.  We will check blood work today and call you with the test results. We will do an EMG and nerve conduction velocity test, which is an electrical nerve and muscle test, which we will schedule. We will call you with the results. For now, we can play it by ear, so long as your test results here are benign.  I would recommend follow up with orthopedics and rheumatology, as I wonder, if your issues are arthritis related and gout related. 

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
## 2018-04-08 NOTE — Progress Notes (Signed)
Pt tolerated exam without incident.  Discharge instructions on post-contrast admin discussed.  Pt denies CP, SOB or lightheadedness or headache.  PIV removed and bandage applied.

## 2018-04-12 ENCOUNTER — Telehealth: Payer: Self-pay

## 2018-04-12 DIAGNOSIS — R7309 Other abnormal glucose: Secondary | ICD-10-CM | POA: Diagnosis not present

## 2018-04-12 LAB — MULTIPLE MYELOMA PANEL, SERUM
Albumin SerPl Elph-Mcnc: 4.2 g/dL (ref 2.9–4.4)
Albumin/Glob SerPl: 1.7 (ref 0.7–1.7)
Alpha 1: 0.2 g/dL (ref 0.0–0.4)
Alpha2 Glob SerPl Elph-Mcnc: 0.5 g/dL (ref 0.4–1.0)
B-Globulin SerPl Elph-Mcnc: 0.9 g/dL (ref 0.7–1.3)
GAMMA GLOB SERPL ELPH-MCNC: 0.9 g/dL (ref 0.4–1.8)
Globulin, Total: 2.5 g/dL (ref 2.2–3.9)
IGG (IMMUNOGLOBIN G), SERUM: 1048 mg/dL (ref 700–1600)
IgA/Immunoglobulin A, Serum: 185 mg/dL (ref 90–386)
IgM (Immunoglobulin M), Srm: 60 mg/dL (ref 20–172)
Total Protein: 6.7 g/dL (ref 6.0–8.5)

## 2018-04-12 LAB — CBC WITH DIFFERENTIAL/PLATELET
Basophils Absolute: 0.1 10*3/uL (ref 0.0–0.2)
Basos: 1 %
EOS (ABSOLUTE): 0.1 10*3/uL (ref 0.0–0.4)
Eos: 3 %
Hematocrit: 43.2 % (ref 37.5–51.0)
Hemoglobin: 15 g/dL (ref 13.0–17.7)
Immature Grans (Abs): 0 10*3/uL (ref 0.0–0.1)
Immature Granulocytes: 0 %
Lymphocytes Absolute: 1.3 10*3/uL (ref 0.7–3.1)
Lymphs: 37 %
MCH: 29.9 pg (ref 26.6–33.0)
MCHC: 34.7 g/dL (ref 31.5–35.7)
MCV: 86 fL (ref 79–97)
Monocytes Absolute: 0.3 10*3/uL (ref 0.1–0.9)
Monocytes: 10 %
Neutrophils Absolute: 1.7 10*3/uL (ref 1.4–7.0)
Neutrophils: 49 %
Platelets: 241 10*3/uL (ref 150–450)
RBC: 5.01 x10E6/uL (ref 4.14–5.80)
RDW: 12.9 % (ref 11.6–15.4)
WBC: 3.6 10*3/uL (ref 3.4–10.8)

## 2018-04-12 LAB — CK: Total CK: 202 U/L (ref 24–204)

## 2018-04-12 LAB — TSH: TSH: 1.49 u[IU]/mL (ref 0.450–4.500)

## 2018-04-12 LAB — URIC ACID: Uric Acid: 9.3 mg/dL — ABNORMAL HIGH (ref 3.7–8.6)

## 2018-04-12 LAB — HGB A1C W/O EAG: Hgb A1c MFr Bld: 5.9 % — ABNORMAL HIGH (ref 4.8–5.6)

## 2018-04-12 LAB — B12 AND FOLATE PANEL
Folate: 5 ng/mL (ref >3.0)
Vitamin B-12: 484 pg/mL (ref 232–1245)

## 2018-04-12 LAB — RHEUMATOID FACTOR: Rheumatoid fact SerPl-aCnc: <10 IU/mL (ref 0.0–13.9)

## 2018-04-12 LAB — ANA W/REFLEX: Anti Nuclear Antibody(ANA): NEGATIVE

## 2018-04-12 LAB — C-REACTIVE PROTEIN: CRP: 2 mg/L (ref 0–10)

## 2018-04-12 NOTE — Telephone Encounter (Signed)
Pt returned my call. I discussed his lab results and Dr. Guadelupe Sabin recommendations regarding them. He will discuss his A1C and the uric acid level further with Dr. [name]. Pt asked that I send Dr. [name] a copy of these results. Pt verbalized understanding of results. Pt had no questions at this time but was encouraged to call back if questions arise.

## 2018-04-12 NOTE — Telephone Encounter (Signed)
I called pt to discuss his lab work. No answer, left a message asking him to call me back.

## 2018-04-12 NOTE — Telephone Encounter (Signed)
-----   Message from Star Age, MD sent at 04/12/2018  8:12 AM EST ----- Pls call pt:  Labs are fine with the exception of diabetes marker in the prediabetes range. Please remind patient that even prediabetes can cause nerve damage sometimes, he is advised to discuss management with primary care physician. In addition, his uric acid is a little elevated, again, I would recommend he discuss this with primary care physician, in addition, we had discussed that he may benefit from seeing orthopedics again and rheumatology. One blood test is pending which is the protein breakdown and antibody content of his blood, we will call if abnormal. Everything else, white cell count, inflammatory markers, autoimmune markers, thyroid function, vitamin B12 were unremarkable and reassuring.

## 2018-04-12 NOTE — Telephone Encounter (Signed)
Pt has called RN Kristen back, he is asking for a call back °

## 2018-04-12 NOTE — Progress Notes (Signed)
Pls call pt:  Labs are fine with the exception of diabetes marker in the prediabetes range. Please remind patient that even prediabetes can cause nerve damage sometimes, he is advised to discuss management with primary care physician. In addition, his uric acid is a little elevated, again, I would recommend he discuss this with primary care physician, in addition, we had discussed that he may benefit from seeing orthopedics again and rheumatology. One blood test is pending which is the protein breakdown and antibody content of his blood, we will call if abnormal. Everything else, white cell count, inflammatory markers, autoimmune markers, thyroid function, vitamin B12 were unremarkable and reassuring.

## 2018-04-15 DIAGNOSIS — Z6834 Body mass index (BMI) 34.0-34.9, adult: Secondary | ICD-10-CM | POA: Diagnosis not present

## 2018-04-15 DIAGNOSIS — M1A9XX1 Chronic gout, unspecified, with tophus (tophi): Secondary | ICD-10-CM | POA: Diagnosis not present

## 2018-04-15 DIAGNOSIS — Z1389 Encounter for screening for other disorder: Secondary | ICD-10-CM | POA: Diagnosis not present

## 2018-04-15 DIAGNOSIS — E6609 Other obesity due to excess calories: Secondary | ICD-10-CM | POA: Diagnosis not present

## 2018-05-05 ENCOUNTER — Encounter: Payer: BLUE CROSS/BLUE SHIELD | Admitting: Neurology

## 2018-05-12 ENCOUNTER — Telehealth: Payer: Self-pay | Admitting: *Deleted

## 2018-05-12 NOTE — Telephone Encounter (Signed)
Open error 

## 2018-05-12 NOTE — Telephone Encounter (Signed)
   Primary Cardiologist:  Glenetta Hew, MD   Patient contacted.  History reviewed.  No symptoms to suggest any unstable cardiac conditions.  Based on discussion, with current pandemic situation, we will be postponing this appointment for Hunter Ray with a plan for f/u in 4-6  wks or sooner if feasible/necessary.  If symptoms change, he has been instructed to contact our office.   Routing to C19 CANCEL pool for tracking  Ines Bloomer  05/12/2018 2:49 PM         .

## 2018-05-13 ENCOUNTER — Ambulatory Visit: Payer: BLUE CROSS/BLUE SHIELD | Admitting: Cardiology

## 2018-08-05 ENCOUNTER — Ambulatory Visit (INDEPENDENT_AMBULATORY_CARE_PROVIDER_SITE_OTHER): Payer: BC Managed Care – PPO | Admitting: Podiatry

## 2018-08-05 ENCOUNTER — Other Ambulatory Visit: Payer: Self-pay | Admitting: Podiatry

## 2018-08-05 ENCOUNTER — Encounter: Payer: Self-pay | Admitting: Podiatry

## 2018-08-05 ENCOUNTER — Ambulatory Visit (INDEPENDENT_AMBULATORY_CARE_PROVIDER_SITE_OTHER): Payer: BC Managed Care – PPO

## 2018-08-05 ENCOUNTER — Other Ambulatory Visit: Payer: Self-pay

## 2018-08-05 ENCOUNTER — Telehealth: Payer: Self-pay | Admitting: *Deleted

## 2018-08-05 VITALS — Temp 98.0°F

## 2018-08-05 DIAGNOSIS — M2041 Other hammer toe(s) (acquired), right foot: Secondary | ICD-10-CM | POA: Diagnosis not present

## 2018-08-05 DIAGNOSIS — M109 Gout, unspecified: Secondary | ICD-10-CM | POA: Diagnosis not present

## 2018-08-05 DIAGNOSIS — M79671 Pain in right foot: Secondary | ICD-10-CM

## 2018-08-05 DIAGNOSIS — Z6833 Body mass index (BMI) 33.0-33.9, adult: Secondary | ICD-10-CM | POA: Diagnosis not present

## 2018-08-05 DIAGNOSIS — Z1389 Encounter for screening for other disorder: Secondary | ICD-10-CM | POA: Diagnosis not present

## 2018-08-05 DIAGNOSIS — M216X9 Other acquired deformities of unspecified foot: Secondary | ICD-10-CM | POA: Diagnosis not present

## 2018-08-05 DIAGNOSIS — L989 Disorder of the skin and subcutaneous tissue, unspecified: Secondary | ICD-10-CM | POA: Diagnosis not present

## 2018-08-05 DIAGNOSIS — M79672 Pain in left foot: Secondary | ICD-10-CM

## 2018-08-05 DIAGNOSIS — M2042 Other hammer toe(s) (acquired), left foot: Secondary | ICD-10-CM

## 2018-08-05 DIAGNOSIS — E6609 Other obesity due to excess calories: Secondary | ICD-10-CM | POA: Diagnosis not present

## 2018-08-05 NOTE — Telephone Encounter (Signed)
"  I scheduled surgery with Dr. Paulla Dolly for July 7.  I would like to see if I can push that back a week to July 14."  It is available.  I'll get it rescheduled.

## 2018-08-05 NOTE — Patient Instructions (Signed)
Pre-Operative Instructions  Congratulations, you have decided to take an important step towards improving your quality of life.  You can be assured that the doctors and staff at [facility] will be with you every step of the way.  Here are some important things you should know:  1. Plan to be at the surgery center/hospital at least 1 (one) hour prior to your scheduled time, unless otherwise directed by the surgical center/hospital staff.  You must have a responsible adult accompany you, remain during the surgery and drive you home.  Make sure you have directions to the surgical center/hospital to ensure you arrive on time. 2. If you are having surgery at [facility] or [facility], you will need a copy of your medical history and physical form from your family physician within one month prior to the date of surgery. We will give you a form for your primary physician to complete.  3. We make every effort to accommodate the date you request for surgery.  However, there are times where surgery dates or times have to be moved.  We will contact you as soon as possible if a change in schedule is required.   4. No aspirin/ibuprofen for one week before surgery.  If you are on aspirin, any non-steroidal anti-inflammatory medications (Mobic, Aleve, Ibuprofen) should not be taken seven (7) days prior to your surgery.  You make take Tylenol for pain prior to surgery.  5. Medications - If you are taking daily heart and blood pressure medications, seizure, reflux, allergy, asthma, anxiety, pain or diabetes medications, make sure you notify the surgery center/hospital before the day of surgery so they can tell you which medications you should take or avoid the day of surgery. 6. No food or drink after midnight the night before surgery unless directed otherwise by surgical center/hospital staff. 7. No alcoholic beverages 24-hours prior to surgery.  No smoking 24-hours prior or 24-hours after  surgery. 8. Wear loose pants or shorts. They should be loose enough to fit over bandages, boots, and casts. 9. Don't wear slip-on shoes. Sneakers are preferred. 10. Bring your boot with you to the surgery center/hospital.  Also bring crutches or a walker if your physician has prescribed it for you.  If you do not have this equipment, it will be provided for you after surgery. 11. If you have not been contacted by the surgery center/hospital by the day before your surgery, call to confirm the date and time of your surgery. 12. Leave-time from work may vary depending on the type of surgery you have.  Appropriate arrangements should be made prior to surgery with your employer. 13. Prescriptions will be provided immediately following surgery by your doctor.  Fill these as soon as possible after surgery and take the medication as directed. Pain medications will not be refilled on weekends and must be approved by the doctor. 14. Remove nail polish on the operative foot and avoid getting pedicures prior to surgery. 15. Wash the night before surgery.  The night before surgery wash the foot and leg well with water and the antibacterial soap provided. Be sure to pay special attention to beneath the toenails and in between the toes.  Wash for at least three (3) minutes. Rinse thoroughly with water and dry well with a towel.  Perform this wash unless told not to do so by your physician.  Enclosed: 1 Ice pack (please put in freezer the night before surgery)   1 Hibiclens skin cleaner     Pre-op instructions  If you have any questions regarding the instructions, please do not hesitate to call our office.  [place]: [address], [place], [place] [address] -- [phone]  [place]: [address] [place], [place] [address] -- [phone]  [place]: [address]  [place], [place] [address] -- [phone]  [place]: [address], [address], [place],  [address] -- [phone]  Website:  [email or web address] 

## 2018-08-05 NOTE — Progress Notes (Signed)
Subjective:   Patient ID: Hunter Ray, male   DOB: 58 y.o.   MRN: [id number]   HPI Patient presents stating the pain on the outside of both feet has been so painful that he cannot walk and the lesions are so painful and he wants to have surgery due to the long-term nature of condition.  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  I explained procedure he is can have both be done at the same time I allowed him to go over consent form reviewing alternative treatments complications and after review patient signed consent form understanding risk.  Patient is scheduled for outpatient surgery and is encouraged to call with questions understands total recovery.  Can take 6 months and there is no guarantee the lesions will not recur  X-rays indicate that there is enlargement around the fifth metatarsal head bilateral with moderate hallux limitus deformity bilateral

## 2018-08-25 ENCOUNTER — Telehealth: Payer: Self-pay | Admitting: *Deleted

## 2018-08-25 ENCOUNTER — Telehealth: Payer: Self-pay | Admitting: Cardiology

## 2018-08-25 NOTE — Telephone Encounter (Signed)
LEFT MESSAGE  FOR PATIENT TO CALL BACK - NEED TO DISCUSS IF PT WANTS TO SWITCH VIRTUAL TO IN OFFICE VISIT -- TO ARRIVE AT 3:20 PM  , INSTEAD OF 3 PM.ON 08/26/18

## 2018-08-25 NOTE — Telephone Encounter (Signed)
PATIENT CALLED BACK--  WOULD LIKE TO HAVE  IN OFFICE VISIT .      Primary Cardiologist: Glenetta Hew, MD   Pt contacted.  History and symptoms reviewed.  Pt will f[facility]u with [facility] provider as scheduled.  Pt. advised that we are restricting visitors at this time and request that only patients present for check-in prior to their appointment.  All other visitors should remain in their car.  If necessary, only one visitor may come with the patient, into the building. For everyone's safety, all patients and visitor entering our practice area should expect to be screened again prior to entering our waiting area.  Raiford Simmonds, RN  08/25/2018 3:20 PM       COVID-19 Pre-Screening Questions:  . In the past 7 to 10 days have you had a cough,  shortness of breath, headache, congestion, fever (100 or greater) body aches, chills, sore throat, or sudden loss of taste or sense of smell?NO . Have you been around anyone with known Covid 19.NO . Have you been around anyone who is awaiting Covid 19 test results in the past 7 to 10 days?NO . Have you been around anyone who has been exposed to Covid 19, or has mentioned symptoms of Covid 19 within the past 7 to 10 days?NO

## 2018-08-25 NOTE — Telephone Encounter (Signed)
LVM, reminding pt of his appt with Dr Ellyn Hack on 08-26-18.

## 2018-08-26 ENCOUNTER — Other Ambulatory Visit: Payer: Self-pay

## 2018-08-26 ENCOUNTER — Encounter: Payer: Self-pay | Admitting: Cardiology

## 2018-08-26 ENCOUNTER — Ambulatory Visit (INDEPENDENT_AMBULATORY_CARE_PROVIDER_SITE_OTHER): Payer: BC Managed Care – PPO | Admitting: Cardiology

## 2018-08-26 VITALS — BP 107/68 | HR 80 | Temp 97.8°F | Ht 74.0 in | Wt 248.8 lb

## 2018-08-26 DIAGNOSIS — R0609 Other forms of dyspnea: Secondary | ICD-10-CM

## 2018-08-26 DIAGNOSIS — I2510 Atherosclerotic heart disease of native coronary artery without angina pectoris: Secondary | ICD-10-CM

## 2018-08-26 DIAGNOSIS — R079 Chest pain, unspecified: Secondary | ICD-10-CM
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
## 2018-08-26 NOTE — Patient Instructions (Addendum)
Medication Instructions:  NO CHANGES   Lab work:  NOT NEEDED  Testing/Procedures: NOT NEEDED  Follow-Up: At [facility], you and your health needs are our priority.  As part of our continuing mission to provide you with exceptional heart care, we have created designated Provider Care Teams.  These Care Teams include your primary Cardiologist (physician) and Advanced Practice Providers (APPs -  Physician Assistants and Nurse Practitioners) who all work together to provide you with the care you need, when you need it. . You will need a follow up appointment ON AN AS NEEDED BASIS  Any Other Special Instructions Will Be Listed Below.

## 2018-08-27 ENCOUNTER — Telehealth: Payer: Self-pay | Admitting: *Deleted

## 2018-08-27 NOTE — Telephone Encounter (Signed)
"  My husband is scheduled for surgery on Tuesday.  He was looking over his instructions and it said something about cleaning his feet with a dial soap brush or something.  He didn't get that.  Can he come by to pick one up?"  Yes, I will leave it at the front desk.

## 2018-08-27 NOTE — Telephone Encounter (Signed)
"  Anesthesia is concerned about patient's recent CT results from cardiologist that list "high risk for future cardiac event" so I have been trying to get cardiac clearance from Dr. Ellyn Hack @ [facility] and am having a hard time. Can you forward to office and see if they have any cardiac clearance for the patient or can help me pressure the cardiologist office to get it?" Thanks,  Rhetta Mura RN [facility] Anesthesia Assessment Nurse ------------------------------------------------------------------------------------------------------------------------------------------  I emailed the clinical note to Caren Griffins at [facility].

## 2018-08-27 NOTE — Telephone Encounter (Signed)
DOS 08/31/2018; 83437 - METATARSAL HEAD RESECTION 5TH B/L AND EXCISION BENIGN LESION OVER 4.0 CM B/L FOOT  BCBS: Effective Date - 02/17/2017 - 02/16/2198  In-Network    Max Per Benefit Period Year-to-Date Remaining  CoInsurance     Deductible  $1,750.00 $0.00  Out-Of-Pocket  $6,850.00 $4,484.45   In-Network Individual  Copay Coinsurance  Not Applicable  35%   Messages: PHYSICIAN BENEFIT   Place of Service: [facility]

## 2018-08-28 ENCOUNTER — Encounter: Payer: Self-pay | Admitting: Cardiology

## 2018-08-28 NOTE — Assessment & Plan Note (Addendum)
Thankfully, he is not having any more symptoms.  Coronary CTA showed nonobstructive CAD. This would argue against angina as etiology.  Suspect it is musculoskeletal.  Continue to recommend risk factor modification.

## 2018-08-28 NOTE — Assessment & Plan Note (Signed)
Essentially normal echo except for grade 2 diastolic dysfunction.  I suspect that this could be part of the reason for his dyspnea, but his blood pressure is well controlled on ARB.  Would not treat any further. I doubt this is related to coronary artery disease because of relatively new nonocclusive disease noted on coronary CTA.  Continue to recommend restricted modifications for coronary disease with blood pressure, glycemic and lipid control. Continue to increase exercise to improve exercise intolerance.

## 2018-08-28 NOTE — Assessment & Plan Note (Signed)
He does have evidence of coronary artery atherosclerosis, albeit nonocclusive with minimal plaque disease.  This would suggest mostly positive remodeling.  In order to maintain nonocclusive disease, would recommend aggressive risk factor modification with blood pressure, glycemic and lipid control.  Okay to defer to PCP.

## 2018-08-31 ENCOUNTER — Encounter: Payer: Self-pay | Admitting: Podiatry

## 2018-08-31 ENCOUNTER — Telehealth: Payer: Self-pay | Admitting: Podiatry

## 2018-08-31 ENCOUNTER — Other Ambulatory Visit: Payer: Self-pay | Admitting: Podiatry

## 2018-08-31 ENCOUNTER — Telehealth: Payer: Self-pay | Admitting: *Deleted

## 2018-08-31 DIAGNOSIS — D2371 Other benign neoplasm of skin of right lower limb, including hip: Secondary | ICD-10-CM | POA: Diagnosis not present

## 2018-08-31 DIAGNOSIS — R2243 Localized swelling, mass and lump, lower limb, bilateral: Secondary | ICD-10-CM | POA: Diagnosis not present

## 2018-08-31 DIAGNOSIS — I10 Essential (primary) hypertension: Secondary | ICD-10-CM | POA: Diagnosis not present

## 2018-08-31 DIAGNOSIS — M216X1 Other acquired deformities of right foot: Secondary | ICD-10-CM | POA: Diagnosis not present

## 2018-08-31 DIAGNOSIS — M7989 Other specified soft tissue disorders: Secondary | ICD-10-CM | POA: Diagnosis not present

## 2018-08-31 DIAGNOSIS — M21541 Acquired clubfoot, right foot: Secondary | ICD-10-CM | POA: Diagnosis not present

## 2018-08-31 DIAGNOSIS — M216X2 Other acquired deformities of left foot: Secondary | ICD-10-CM | POA: Diagnosis not present

## 2018-08-31 DIAGNOSIS — M21542 Acquired clubfoot, left foot: Secondary | ICD-10-CM | POA: Diagnosis not present

## 2018-08-31 MED ORDER — PROMETHAZINE HCL 25 MG PO TABS: 25.0000 mg | ORAL_TABLET | Freq: Three times a day (TID) | ORAL | 0 refills | Status: DC | PRN

## 2018-08-31 MED ORDER — HYDROCODONE-ACETAMINOPHEN 10-325 MG PO TABS: 1.0000 | ORAL_TABLET | Freq: Four times a day (QID) | ORAL | 0 refills | Status: AC | PRN

## 2018-08-31 NOTE — Telephone Encounter (Signed)
Pts wife called stating the pt had surgery today and would like for the doctor to call in a nausea medication. She believes the pain medication might be causing the nausea but is unsure.

## 2018-08-31 NOTE — Progress Notes (Signed)
Filled pain medication at Dr. Paulla Dolly request

## 2018-08-31 NOTE — Telephone Encounter (Signed)
Dr. Paulla Dolly called states he was not able to get the Hydrocodone with Acetaminophen 10/325mg  #25 one tablet every 4-6 hours to the pt, have one of the other doctors escribe.

## 2018-08-31 NOTE — Telephone Encounter (Signed)
Done. However please make sure he is not having any chest pain, shortness or breath. If it corresponds to the pain medication then we can try changing that.

## 2018-09-01 NOTE — Telephone Encounter (Signed)
I spoke with pt and asked if he was having any chest pain and shortness of breath and pt denied. I told pt I would give him the quick version of what we would like him to do during the 1st week post op, I told pt to remain in the boot at all times, do not have the surgery foot below his heart, dangling or weight bearing more than 15 minutes/hour, and ice at least 15 minutes/hours, and to call with concerns. Pt states understanding.

## 2018-09-07 DIAGNOSIS — M216X2 Other acquired deformities of left foot: Secondary | ICD-10-CM | POA: Diagnosis not present

## 2018-09-07 DIAGNOSIS — M216X1 Other acquired deformities of right foot: Secondary | ICD-10-CM | POA: Diagnosis not present

## 2018-09-08 ENCOUNTER — Ambulatory Visit (INDEPENDENT_AMBULATORY_CARE_PROVIDER_SITE_OTHER): Payer: BC Managed Care – PPO | Admitting: Podiatry

## 2018-09-08 ENCOUNTER — Encounter: Payer: Self-pay | Admitting: Podiatry

## 2018-09-08 ENCOUNTER — Ambulatory Visit (INDEPENDENT_AMBULATORY_CARE_PROVIDER_SITE_OTHER): Payer: BC Managed Care – PPO

## 2018-09-08 ENCOUNTER — Other Ambulatory Visit: Payer: Self-pay

## 2018-09-08 DIAGNOSIS — M21541 Acquired clubfoot, right foot: Secondary | ICD-10-CM | POA: Diagnosis not present

## 2018-09-08 DIAGNOSIS — M21542 Acquired clubfoot, left foot: Secondary | ICD-10-CM | POA: Diagnosis not present

## 2018-09-08 DIAGNOSIS — Z09 Encounter for follow-up examination after completed treatment for conditions other than malignant neoplasm: Secondary | ICD-10-CM

## 2018-09-08 DIAGNOSIS — M205X9 Other deformities of toe(s) (acquired), unspecified foot: Secondary | ICD-10-CM

## 2018-09-08 DIAGNOSIS — M216X9 Other acquired deformities of unspecified foot: Secondary | ICD-10-CM

## 2018-09-08 NOTE — Progress Notes (Signed)
Subjective:   Patient ID: Hunter Ray, male   DOB: 58 y.o.   MRN: [id number]   HPI Patient states he is doing well with surgery and is walking with a good heel toe gait with only mild discomfort   ROS      Objective:  Physical Exam  Neurovascular status intact negative Homans sign noted with patient's feet doing well with wound edges well coapted and plantar incisions doing well with stitches intact     Assessment:  Doing well post fifth metatarsal head resection bilateral along with excision of chronic plantar lesions bilateral     Plan:  H&P x-rays discussed and reapplied sterile dressing and instructed to continue elevation compression immobilization and reappoint to recheck  X-ray indicates that satisfactory section of fifth metatarsal head bilateral has been accomplished

## 2018-09-16 ENCOUNTER — Ambulatory Visit: Payer: BC Managed Care – PPO

## 2018-09-16 ENCOUNTER — Other Ambulatory Visit: Payer: Self-pay

## 2018-09-16 DIAGNOSIS — M205X9 Other deformities of toe(s) (acquired), unspecified foot: Secondary | ICD-10-CM

## 2018-09-16 DIAGNOSIS — M216X9 Other acquired deformities of unspecified foot: Secondary | ICD-10-CM

## 2018-09-16 DIAGNOSIS — Z09 Encounter for follow-up examination after completed treatment for conditions other than malignant neoplasm: Secondary | ICD-10-CM

## 2018-09-23 ENCOUNTER — Ambulatory Visit (INDEPENDENT_AMBULATORY_CARE_PROVIDER_SITE_OTHER): Payer: BC Managed Care – PPO

## 2018-09-23 ENCOUNTER — Ambulatory Visit: Payer: BC Managed Care – PPO

## 2018-09-23 ENCOUNTER — Encounter: Payer: Self-pay | Admitting: Podiatry

## 2018-09-23 ENCOUNTER — Other Ambulatory Visit: Payer: Self-pay

## 2018-09-23 ENCOUNTER — Ambulatory Visit (INDEPENDENT_AMBULATORY_CARE_PROVIDER_SITE_OTHER): Payer: BC Managed Care – PPO | Admitting: Podiatry

## 2018-09-23 VITALS — Temp 98.2°F

## 2018-09-23 DIAGNOSIS — M216X2 Other acquired deformities of left foot: Secondary | ICD-10-CM

## 2018-09-23 DIAGNOSIS — M216X9 Other acquired deformities of unspecified foot: Secondary | ICD-10-CM

## 2018-09-23 DIAGNOSIS — Z09 Encounter for follow-up examination after completed treatment for conditions other than malignant neoplasm: Secondary | ICD-10-CM

## 2018-09-23 DIAGNOSIS — M216X1 Other acquired deformities of right foot: Secondary | ICD-10-CM

## 2018-09-23 NOTE — Progress Notes (Signed)
Dg  

## 2018-09-23 NOTE — Progress Notes (Signed)
Subjective:   Patient ID: Hunter Ray, male   DOB: 58 y.o.   MRN: [id number]   HPI Patient presents stating doing pretty well with moderate swelling and states that he is walking with a reasonable good gait pattern currently   ROS      Objective:  Physical Exam  Neurovascular status intact with crusted tissue formation fifth metatarsal bilateral and plantar incisions bilateral from excision of soft tissue mass.  Patient has minimal discomfort and is walking with a good heel toe gait      Assessment:  Doing well post fifth metatarsal head resection bilateral and excision of plantar callus bilateral     Plan:  H&P conditions reviewed stitches removed wound edges coapted well and gave instructions for compression elevation immobilization and dispensed ankle compression stockings bilateral.  Reappoint 4 weeks to reevaluate or earlier if needed  X-rays indicate satisfactory resection of bone with no indications of significant pathology

## 2018-09-29 ENCOUNTER — Other Ambulatory Visit: Payer: Self-pay | Admitting: Podiatry

## 2018-09-29 NOTE — Telephone Encounter (Signed)
Please advise. Thanks Morrill Bomkamp 

## 2018-09-29 NOTE — Telephone Encounter (Signed)
Please Advise? Thanks Lattie Haw

## 2018-09-30 NOTE — Telephone Encounter (Signed)
This is Dr. Paulla Dolly patient- I had to resend the medications for him.

## 2018-09-30 NOTE — Telephone Encounter (Signed)
This is Dr. Mellody Drown patinet- I had to send the medication for him as he was not in the office when you needed it done originally.

## 2018-10-05 ENCOUNTER — Other Ambulatory Visit: Payer: Self-pay | Admitting: Podiatry

## 2018-10-11 ENCOUNTER — Telehealth: Payer: Self-pay

## 2018-10-11 NOTE — Telephone Encounter (Signed)
Refill for promethazine denied, refill not appropriate

## 2018-10-21 ENCOUNTER — Ambulatory Visit (INDEPENDENT_AMBULATORY_CARE_PROVIDER_SITE_OTHER): Payer: Self-pay | Admitting: Podiatry

## 2018-10-21 ENCOUNTER — Other Ambulatory Visit: Payer: Self-pay

## 2018-10-21 ENCOUNTER — Ambulatory Visit (INDEPENDENT_AMBULATORY_CARE_PROVIDER_SITE_OTHER): Payer: BC Managed Care – PPO

## 2018-10-21 ENCOUNTER — Encounter: Payer: Self-pay | Admitting: Podiatry

## 2018-10-21 DIAGNOSIS — M216X1 Other acquired deformities of right foot: Secondary | ICD-10-CM | POA: Diagnosis not present

## 2018-10-21 DIAGNOSIS — M216X2 Other acquired deformities of left foot: Secondary | ICD-10-CM

## 2018-10-21 DIAGNOSIS — M216X9 Other acquired deformities of unspecified foot: Secondary | ICD-10-CM

## 2018-10-21 DIAGNOSIS — Z09 Encounter for follow-up examination after completed treatment for conditions other than malignant neoplasm: Secondary | ICD-10-CM

## 2018-10-21 NOTE — Progress Notes (Signed)
Subjective:   Patient ID: Hunter Ray, male   DOB: 58 y.o.   MRN: [id number]   HPI Patient presents stating doing pretty well with discomfort if he does too much and swelling if he is on it too much but overall he is satisfied   ROS      Objective:  Physical Exam  Neurovascular status intact negative Homans sign noted wound edges coapted well within the fifth metatarsal bilateral plantar with no indications for callus formation     Assessment:  Making good progress from fifth metatarsal head resection excision of plantar lesions     Plan:  Advised on anti-inflammatories physical therapy continued range of motion exercise gradual increase in activity and reappoint for final visit 6 weeks  X-rays indicate satisfactory section of bone with no indications of bone  encroachment at this time

## 2018-10-22 NOTE — Progress Notes (Signed)
Patient is here today with concern his surgical incisions may be opening.  He states he has been on his feet a little bit more than he should have because he has been working.  Noted well-healing surgical incisions, and there was no gapping, wound edges were aligned and approximated.  Sutures were intact.  There was some notable swelling on the lateral sides of both feet, but this is within normal limits given his prolonged standing and and surgery.  Advised him to rest utilize compression and ice and keep his current postop appointment.

## 2018-12-02 ENCOUNTER — Other Ambulatory Visit: Payer: Self-pay

## 2018-12-02 ENCOUNTER — Encounter: Payer: Self-pay | Admitting: Podiatry

## 2018-12-02 ENCOUNTER — Ambulatory Visit (INDEPENDENT_AMBULATORY_CARE_PROVIDER_SITE_OTHER): Payer: BC Managed Care – PPO | Admitting: Podiatry

## 2018-12-02 ENCOUNTER — Ambulatory Visit (INDEPENDENT_AMBULATORY_CARE_PROVIDER_SITE_OTHER): Payer: BC Managed Care – PPO

## 2018-12-02 DIAGNOSIS — M1000 Idiopathic gout, unspecified site: Secondary | ICD-10-CM

## 2018-12-02 DIAGNOSIS — M216X2 Other acquired deformities of left foot: Secondary | ICD-10-CM | POA: Diagnosis not present

## 2018-12-02 DIAGNOSIS — M216X9 Other acquired deformities of unspecified foot: Secondary | ICD-10-CM

## 2018-12-02 DIAGNOSIS — M216X1 Other acquired deformities of right foot: Secondary | ICD-10-CM | POA: Diagnosis not present

## 2018-12-02 NOTE — Patient Instructions (Signed)

## 2018-12-06 NOTE — Progress Notes (Signed)
Subjective:   Patient ID: Hunter Ray, male   DOB: 58 y.o.   MRN: [id number]   HPI Patient presents stating he is doing really well with swelling if he is on his foot all day but overall significant reduction of discomfort   ROS      Objective:  Physical Exam  Neurovascular status intact with patients incision sites on the fifth metatarsal both feet doing much better with wound edges coapted well and no discomfort when I pressed     Assessment:  Very pleased with how patient is doing with patient having significant diminishment of discomfort pain     Plan:  Reviewed final x-rays discussed that it is normal to still have mild discomfort but this should go away over time and encouraged him to resume normal activity  X-rays indicate satisfactory resection of head of fifth metatarsal bilateral with good alignment noted

## 2018-12-16 DIAGNOSIS — Z0001 Encounter for general adult medical examination with abnormal findings: Secondary | ICD-10-CM | POA: Diagnosis not present

## 2018-12-16 DIAGNOSIS — I10 Essential (primary) hypertension: Secondary | ICD-10-CM | POA: Diagnosis not present

## 2018-12-16 DIAGNOSIS — R7309 Other abnormal glucose: Secondary | ICD-10-CM | POA: Diagnosis not present

## 2018-12-16 DIAGNOSIS — M109 Gout, unspecified: Secondary | ICD-10-CM | POA: Diagnosis not present

## 2018-12-16 DIAGNOSIS — Z6833 Body mass index (BMI) 33.0-33.9, adult: Secondary | ICD-10-CM | POA: Diagnosis not present

## 2018-12-16 DIAGNOSIS — M5030 Other cervical disc degeneration, unspecified cervical region: Secondary | ICD-10-CM | POA: Diagnosis not present

## 2018-12-16 DIAGNOSIS — E7849 Other hyperlipidemia: Secondary | ICD-10-CM | POA: Diagnosis not present

## 2019-03-03 DIAGNOSIS — Z6834 Body mass index (BMI) 34.0-34.9, adult: Secondary | ICD-10-CM | POA: Diagnosis not present

## 2019-03-03 DIAGNOSIS — E6609 Other obesity due to excess calories: Secondary | ICD-10-CM | POA: Diagnosis not present

## 2019-03-03 DIAGNOSIS — Z1389 Encounter for screening for other disorder: Secondary | ICD-10-CM | POA: Diagnosis not present

## 2019-03-03 DIAGNOSIS — M109 Gout, unspecified: Secondary | ICD-10-CM | POA: Diagnosis not present

## 2019-03-03 DIAGNOSIS — T452X5A Adverse effect of vitamins, initial encounter: Secondary | ICD-10-CM | POA: Diagnosis not present

## 2019-03-10 DIAGNOSIS — M25522 Pain in left elbow: Secondary | ICD-10-CM | POA: Diagnosis not present

## 2019-03-10 DIAGNOSIS — M79641 Pain in right hand: Secondary | ICD-10-CM | POA: Diagnosis not present

## 2019-03-10 DIAGNOSIS — M79673 Pain in unspecified foot: Secondary | ICD-10-CM | POA: Diagnosis not present

## 2019-03-10 DIAGNOSIS — M7989 Other specified soft tissue disorders: Secondary | ICD-10-CM | POA: Diagnosis not present

## 2019-03-10 DIAGNOSIS — M79671 Pain in right foot: Secondary | ICD-10-CM | POA: Diagnosis not present

## 2019-03-10 DIAGNOSIS — M79643 Pain in unspecified hand: Secondary | ICD-10-CM | POA: Diagnosis not present

## 2019-03-10 DIAGNOSIS — M79642 Pain in left hand: Secondary | ICD-10-CM | POA: Diagnosis not present

## 2019-03-10 DIAGNOSIS — M79672 Pain in left foot: Secondary | ICD-10-CM | POA: Diagnosis not present

## 2019-03-10 DIAGNOSIS — M1A9XX1 Chronic gout, unspecified, with tophus (tophi): Secondary | ICD-10-CM | POA: Diagnosis not present

## 2019-03-10 DIAGNOSIS — M25521 Pain in right elbow: Secondary | ICD-10-CM | POA: Diagnosis not present

## 2019-04-21 DIAGNOSIS — Z20822 Contact with and (suspected) exposure to covid-19: Secondary | ICD-10-CM | POA: Diagnosis not present

## 2019-05-05 DIAGNOSIS — M79643 Pain in unspecified hand: Secondary | ICD-10-CM | POA: Diagnosis not present

## 2019-05-05 DIAGNOSIS — M7989 Other specified soft tissue disorders: Secondary | ICD-10-CM | POA: Diagnosis not present

## 2019-05-05 DIAGNOSIS — M1A9XX1 Chronic gout, unspecified, with tophus (tophi): Secondary | ICD-10-CM | POA: Diagnosis not present

## 2019-05-05 DIAGNOSIS — M79673 Pain in unspecified foot: Secondary | ICD-10-CM | POA: Diagnosis not present

## 2019-06-16 DIAGNOSIS — M79643 Pain in unspecified hand: Secondary | ICD-10-CM | POA: Diagnosis not present

## 2019-06-16 DIAGNOSIS — M1A9XX1 Chronic gout, unspecified, with tophus (tophi): Secondary | ICD-10-CM | POA: Diagnosis not present

## 2019-06-16 DIAGNOSIS — M7989 Other specified soft tissue disorders: Secondary | ICD-10-CM | POA: Diagnosis not present

## 2019-06-16 DIAGNOSIS — M79673 Pain in unspecified foot: Secondary | ICD-10-CM | POA: Diagnosis not present

## 2019-07-21 DIAGNOSIS — M1A9XX1 Chronic gout, unspecified, with tophus (tophi): Secondary | ICD-10-CM | POA: Diagnosis not present

## 2019-07-28 DIAGNOSIS — M1A9XX1 Chronic gout, unspecified, with tophus (tophi): Secondary | ICD-10-CM | POA: Diagnosis not present

## 2019-07-28 DIAGNOSIS — M79673 Pain in unspecified foot: Secondary | ICD-10-CM | POA: Diagnosis not present

## 2019-07-28 DIAGNOSIS — M7989 Other specified soft tissue disorders: Secondary | ICD-10-CM | POA: Diagnosis not present

## 2019-07-28 DIAGNOSIS — M79643 Pain in unspecified hand: Secondary | ICD-10-CM | POA: Diagnosis not present

## 2019-08-04 DIAGNOSIS — M1A9XX1 Chronic gout, unspecified, with tophus (tophi): Secondary | ICD-10-CM | POA: Diagnosis not present

## 2019-09-07 IMAGING — CR DG CHEST 2V
2 series · 2 of 2 positions shown · non-contrast
Comparison: 10/26/2017

CLINICAL DATA: Right-sided chest pain.

EXAM:
CHEST - 2 VIEW

[w chest pa]
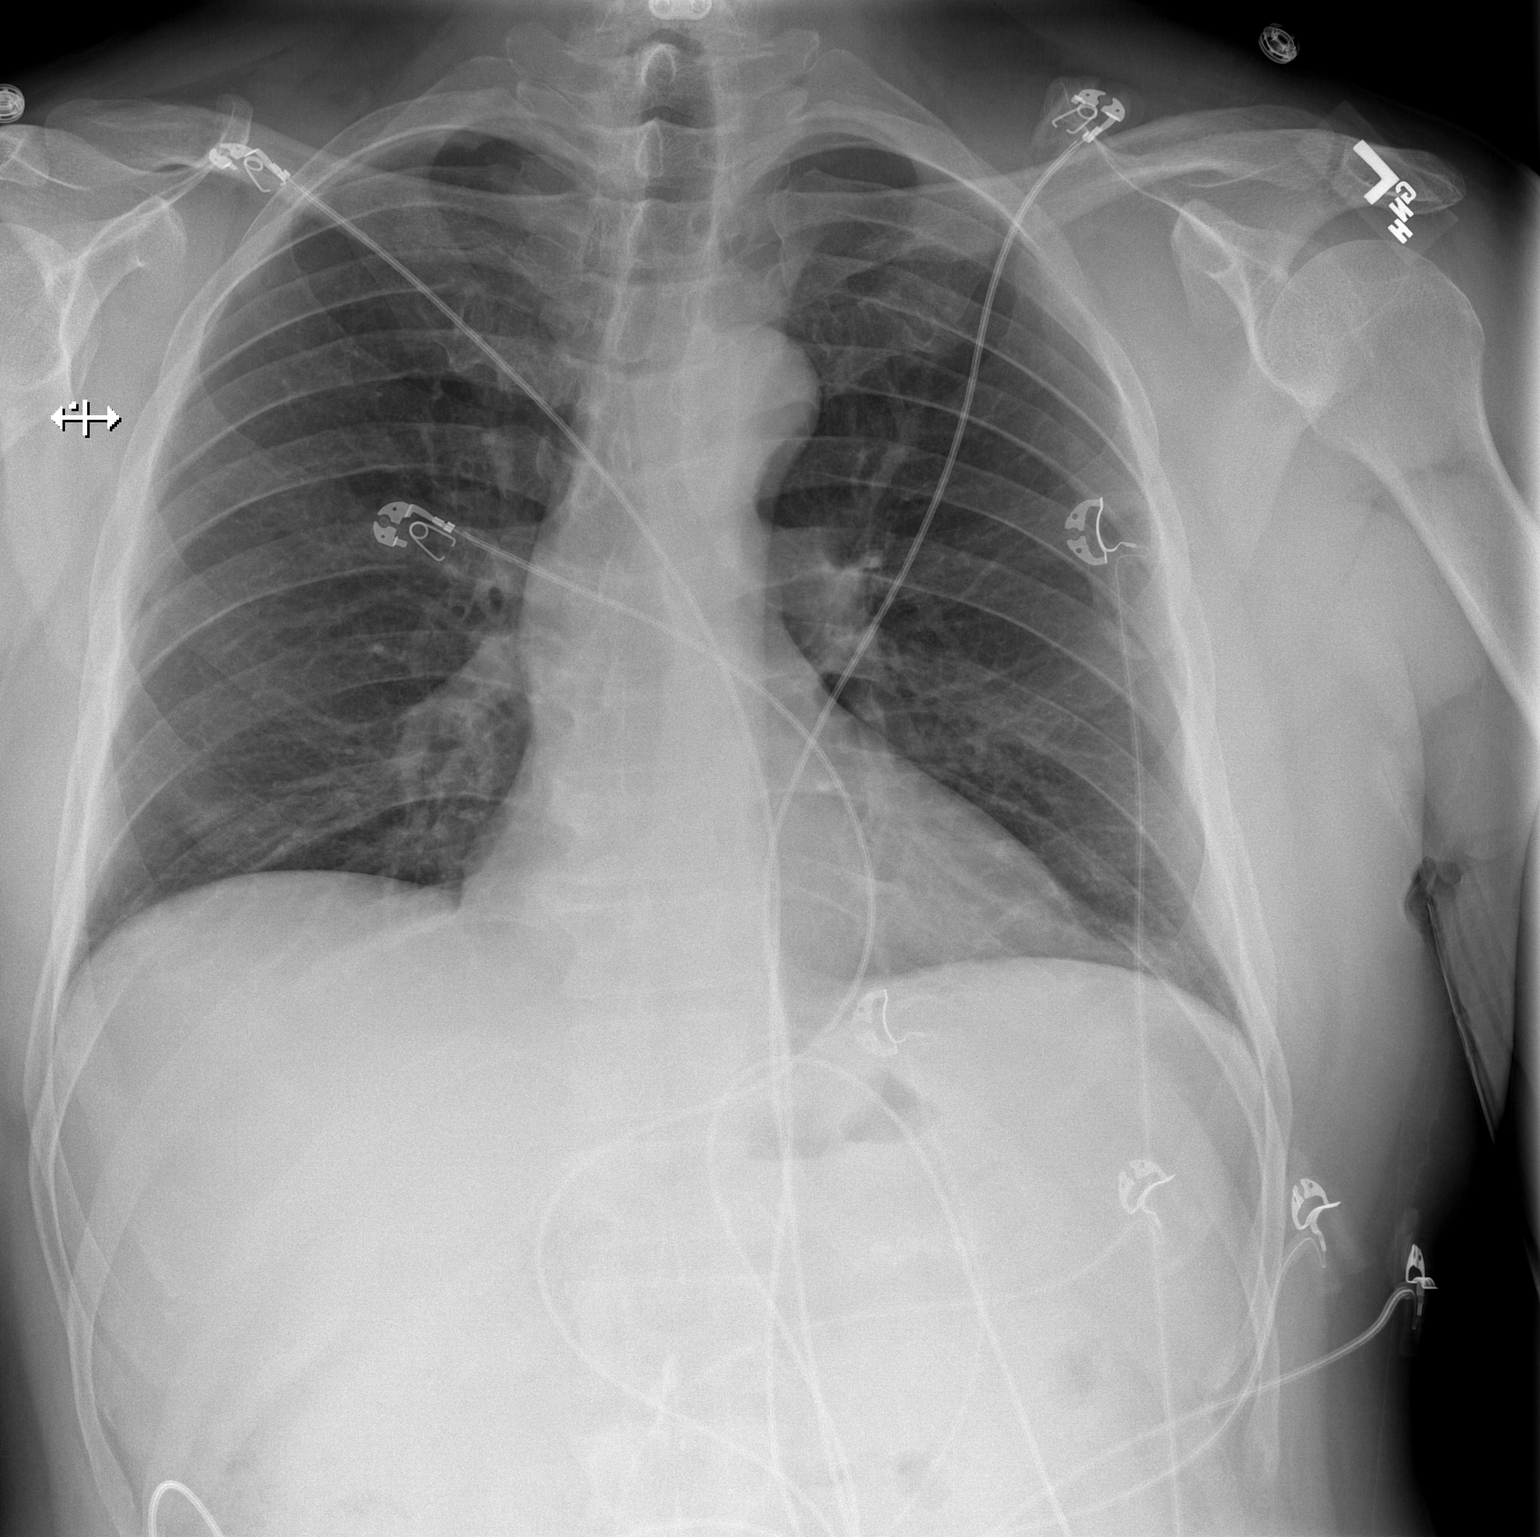

[w chest lat]
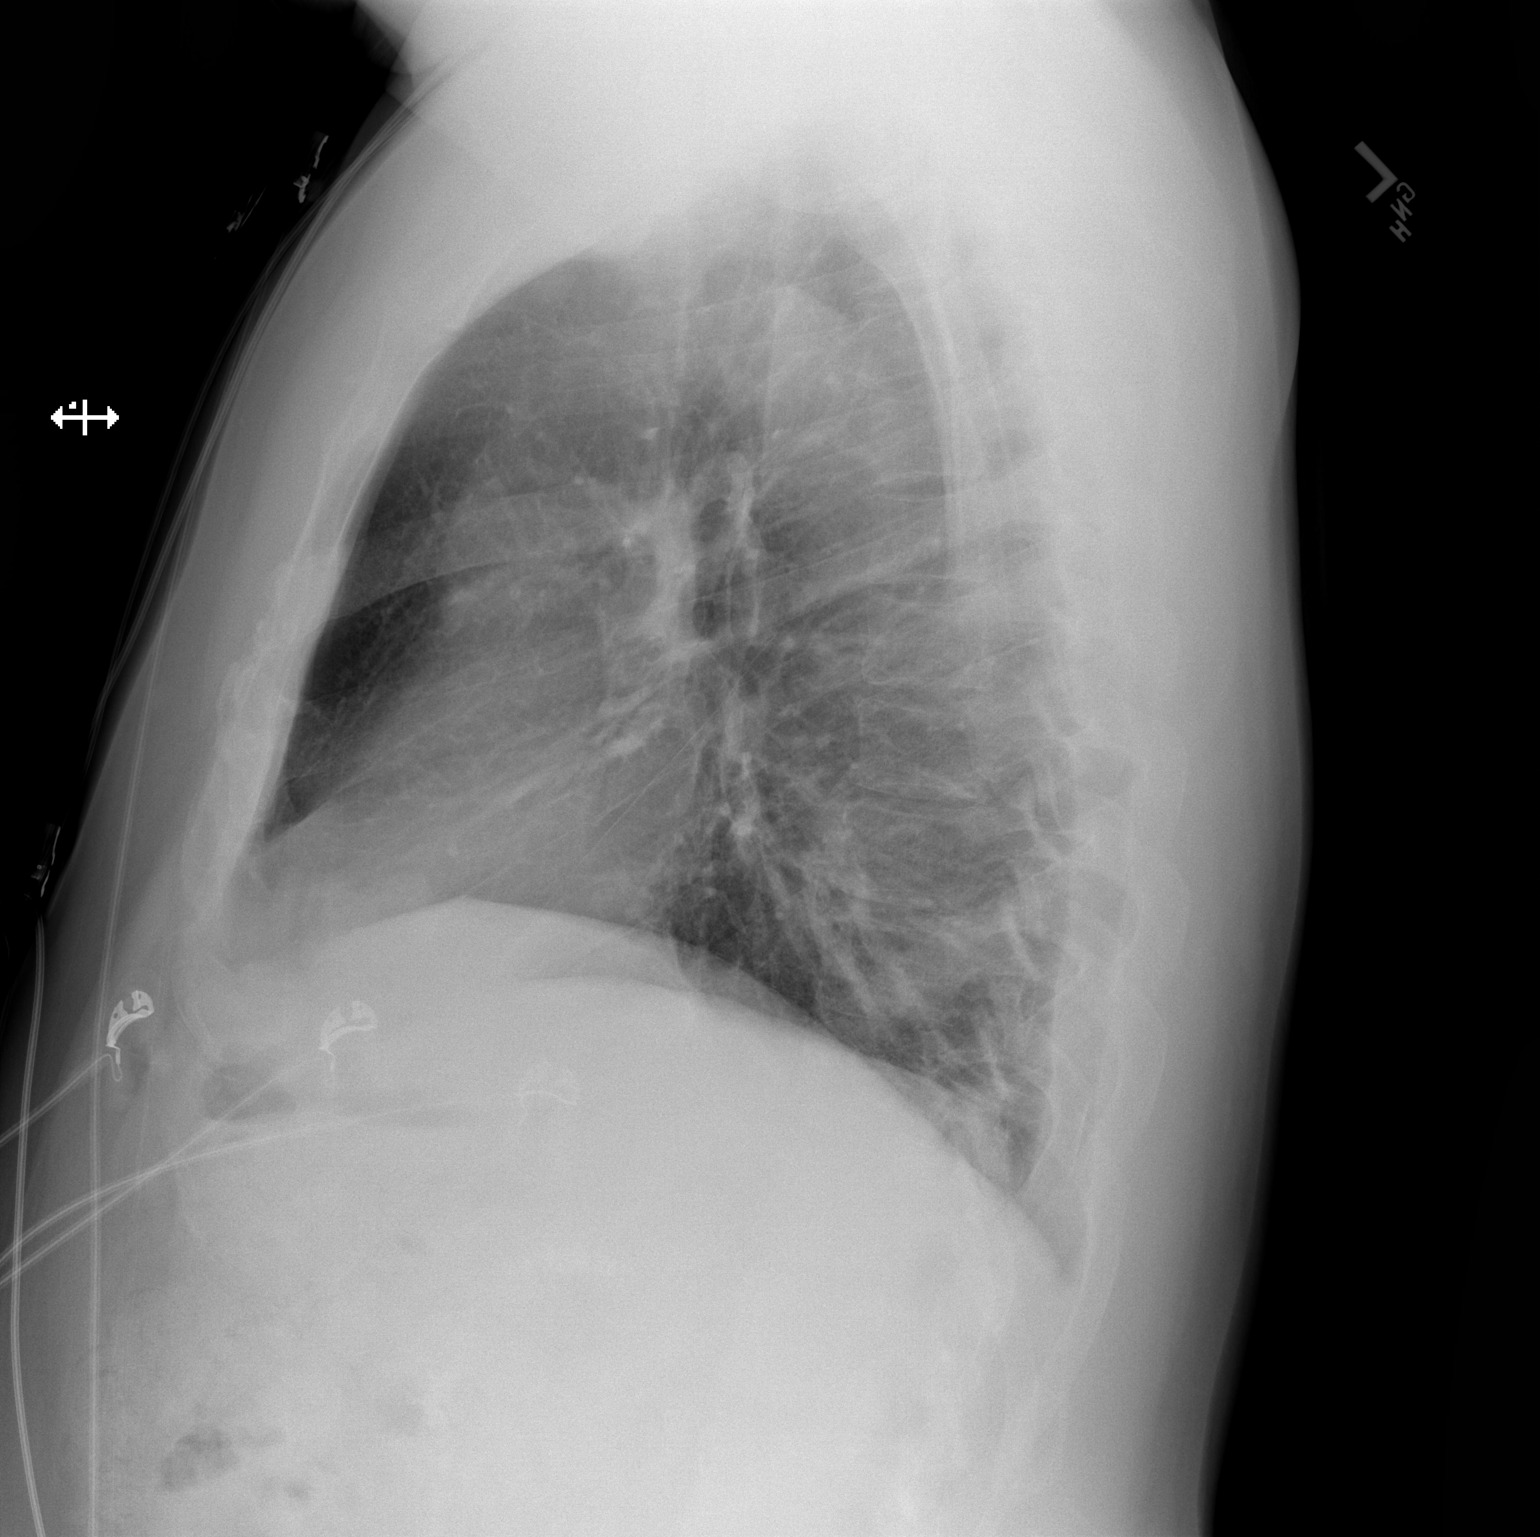

[2 of 2 positions shown; findings below may reference images not displayed]

FINDINGS: Cardiomediastinal silhouette is normal. Mediastinal contours appear
intact.

There is no evidence of pleural effusion or pneumothorax. Linear
peribronchial thickening in the right lower lobe.

Osseous structures are without acute abnormality. Soft tissues are
grossly normal.
IMPRESSION: Linear peribronchial thickening in the right lower lobe, usually
associated with atelectasis/scarring versus peribronchial airspace
consolidation. Please correlate clinically.

## 2019-09-07 IMAGING — CT CT CERVICAL SPINE W/O CM
3 of 7 series · 12 of 33 positions shown, 13 images · non-contrast
Comparison: Cervical MRI October 11, 2012

CLINICAL DATA: Severe headache with radicular symptoms

EXAM:
CT HEAD WITHOUT CONTRAST
CT CERVICAL SPINE WITHOUT CONTRAST
TECHNIQUE: Multidetector CT imaging of the head and cervical spine was
performed following the standard protocol without intravenous
contrast. Multiplanar CT image reconstructions of the cervical spine
were also generated.

[Series 505: coronal soft tissue · coronal · 0.36mm/px · 3 of 82 slices shown]
[im 21/82  bone]
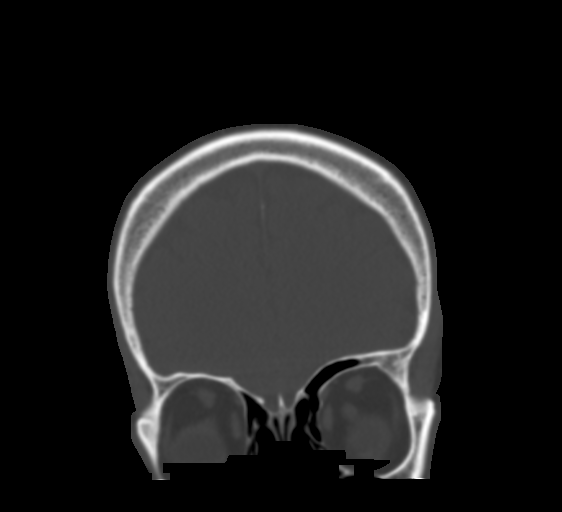
[im 41/82  bone]
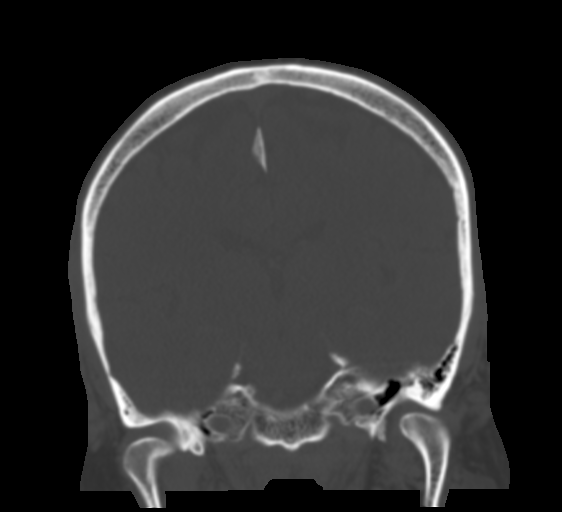
[im 61/82  bone]
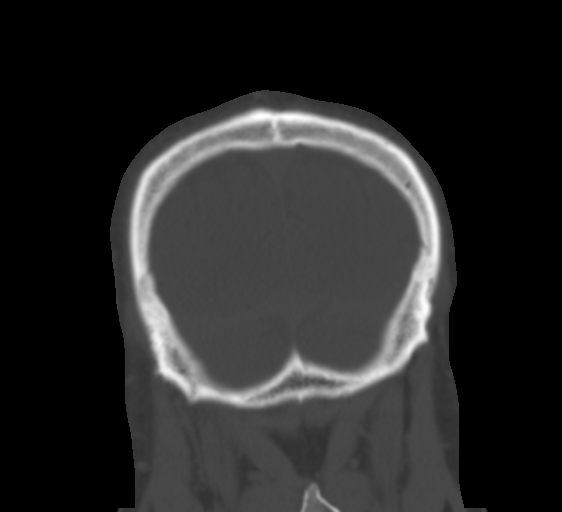

[Series 511: sagittal bone · sagittal · 0.33mm/px · 5 of 74 slices shown]
[im 13/74  bone]
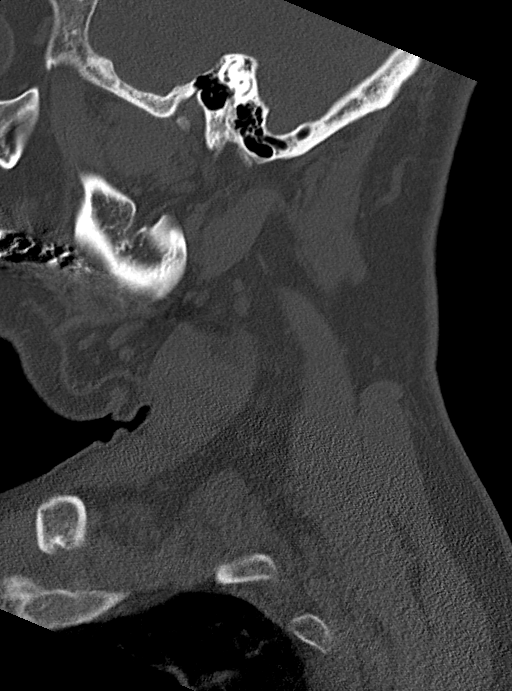
[im 25/74  bone]
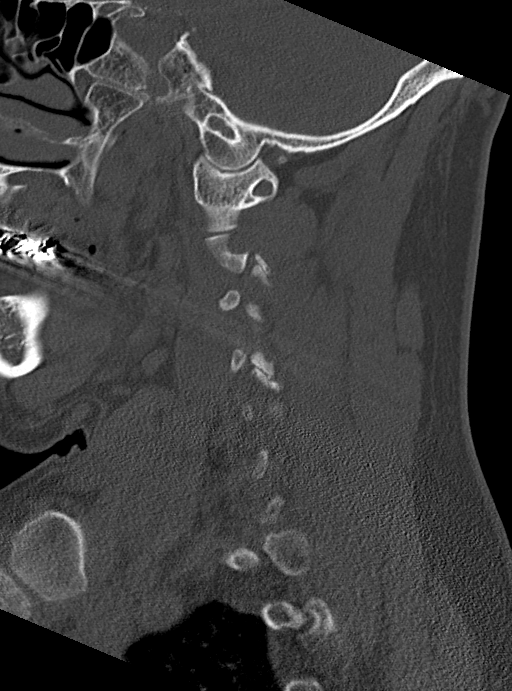
[im 37/74  bone]
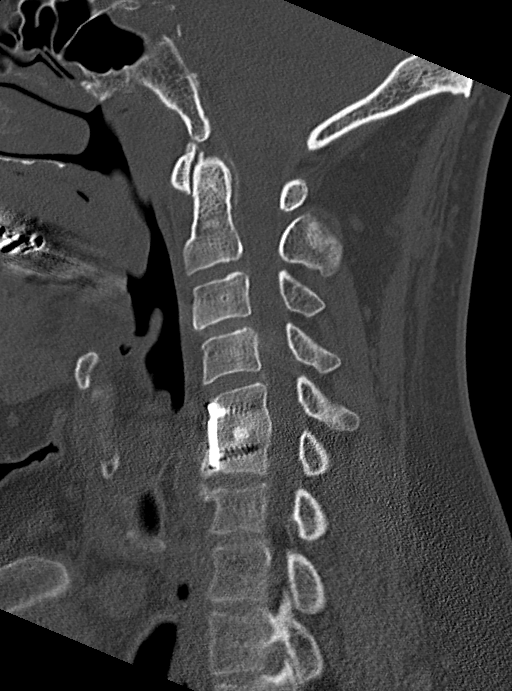
[im 49/74  bone]
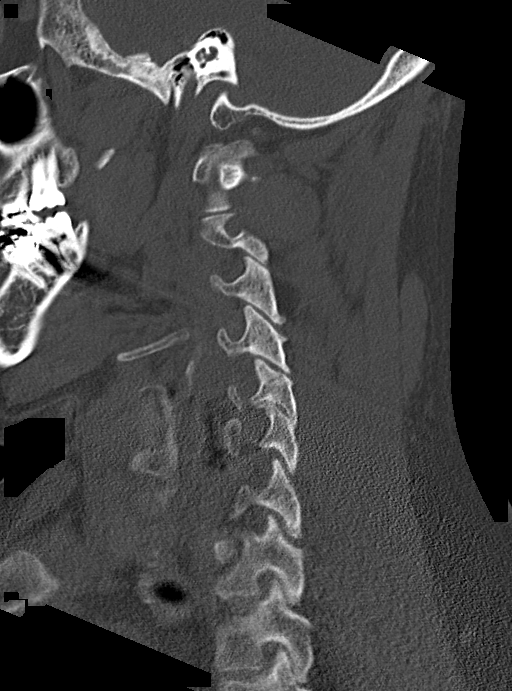
[im 61/74  bone]
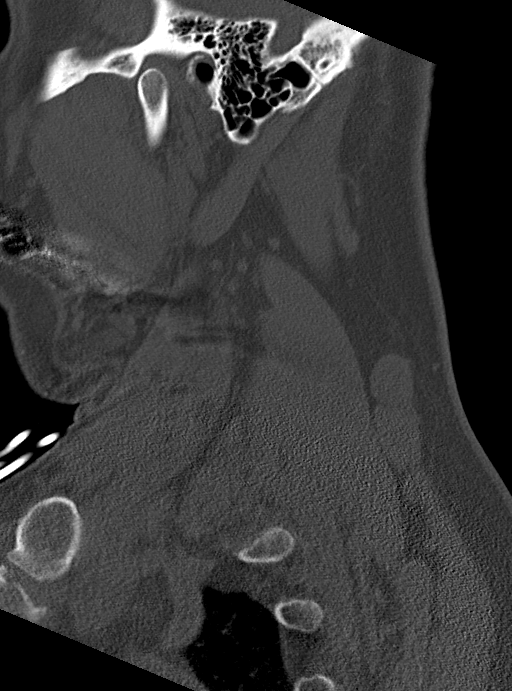

[Series 512: orthogonal bone · axial · 0.23mm/px · z∈[-292,-169]mm · 4 of 111 slices shown, 5 images]
[im 23/111  soft-tissue]
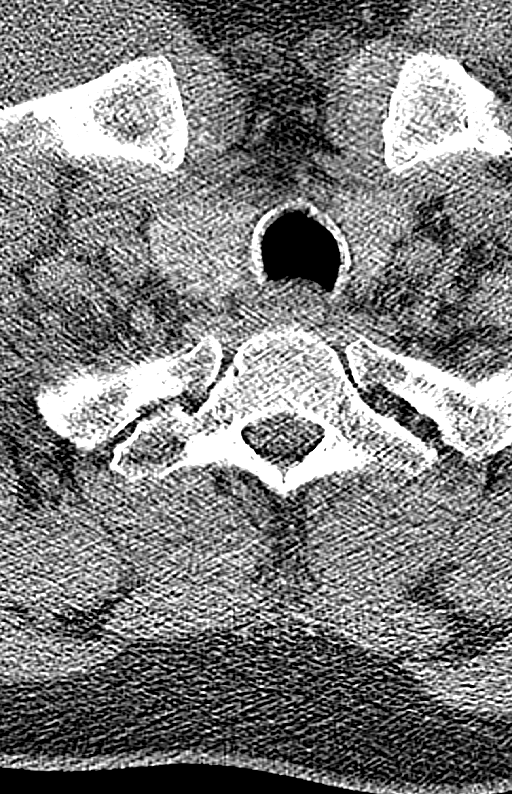
[im 23/111  bone]
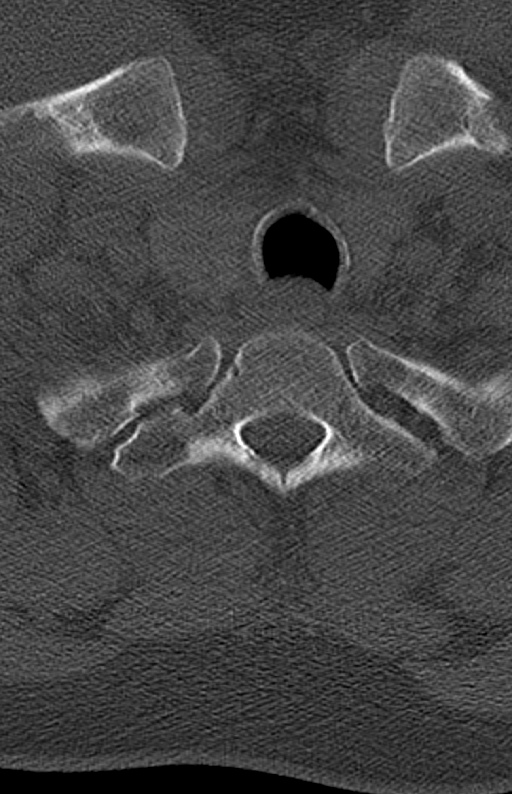
[im 45/111  bone]
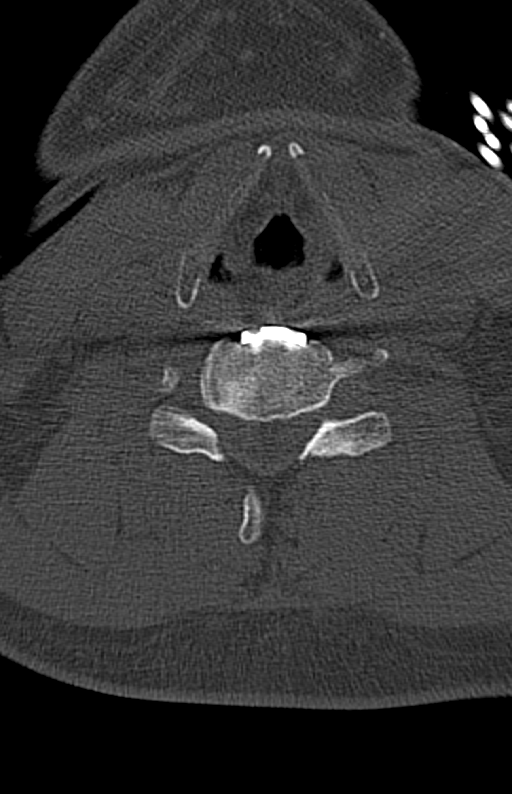
[im 67/111  bone]
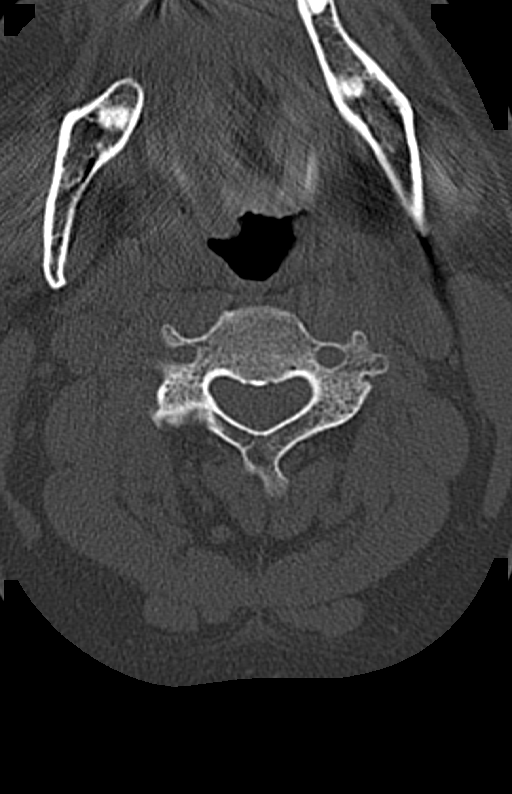
[im 89/111  bone]
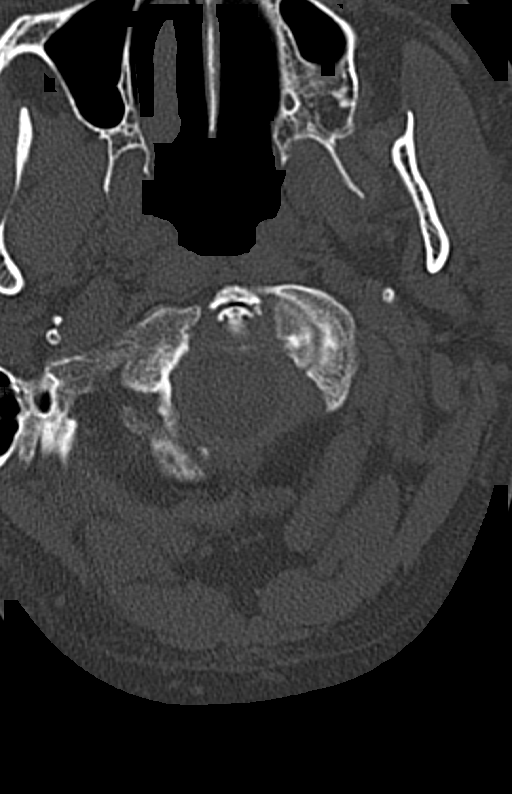

[12 of 33 positions shown; findings below may reference images not displayed]

FINDINGS: CT HEAD FINDINGS

Brain: Ventricles are normal in size and configuration. There is no
intracranial mass, hemorrhage, extra-axial fluid collection, or
midline shift. Gray-white compartments appear normal. No evident
acute infarct.

Vascular: No hyperdense vessel. There is mild calcification in the
left carotid siphon.

Skull: The bony calvarium appears intact.

Sinuses/Orbits: Visualized paranasal sinuses are clear. Visualized
orbits appear symmetric bilaterally.

Other: Mastoid air cells are clear. There is debris in the left and
right external auditory canals.

CT CERVICAL SPINE FINDINGS

Alignment: There is no appreciable spondylolisthesis.

Skull base and vertebrae: Skull base and craniocervical junction
regions appear normal. Mild pannus posterior to the odontoid is not
causing appreciable impression on the craniocervical junction.

The patient is status post anterior screw and plate fixation at C5
and C6 with screw and plate fixation device intact. There is a disc
spacer at C5-6. There is no fracture. There is no blastic or lytic
bone lesion.

Soft tissues and spinal canal: Prevertebral soft tissues and
predental space regions are normal. There is no paraspinous lesion.
There is no evident cord or canal hematoma.

Disc levels: There is ankylosis at C5-6. Other disc spaces appear
unremarkable. There is calcification in the anterior ligament at
C6-7.

At C2-3, there is moderate facet hypertrophy bilaterally. There is
no evident nerve root edema or effacement. No disc extrusion or
stenosis.

At C3-4, there is moderate facet hypertrophy bilaterally. There is
exit foraminal narrowing bilaterally with impression on the exiting
nerve root at C3-4 on the left. No frank disc extrusion or stenosis.

At C4-5, there is moderate facet hypertrophy bilaterally. No nerve
root edema or effacement. No disc extrusion or stenosis evident.

At C5-6, there is moderate facet hypertrophy bilaterally. There is
mild exit foraminal narrowing on the right without nerve root edema
or effacement. No disc extrusion or stenosis.

At C6-7, there is moderate facet hypertrophy bilaterally without
nerve root edema or effacement. No disc extrusion or stenosis.

At C7-T1, there is mild facet hypertrophy bilaterally without nerve
root edema or effacement. There is mild diffuse disc bulging. No
disc extrusion or stenosis.

Upper chest: Visualized upper lung zones are clear.

Other: None
IMPRESSION: CT head: Gray-white compartments appear normal. No mass or
hemorrhage. There is slight arterial vascular calcification. There
is probable cerumen in each external auditory canal.

CT cervical spine:

1.  No fracture or spondylolisthesis.

2. Status post anterior screw and plate fixation/fusion at C5 and C6
with ankylosis at C5-6. Support hardware intact.

3. Facet osteoarthritic change at multiple levels. There is
impression on the exiting nerve root on the left at C3-4 due to bony
hypertrophy. No frank disc extrusion or stenosis evident.

## 2019-09-15 ENCOUNTER — Other Ambulatory Visit: Payer: Self-pay

## 2019-09-15 ENCOUNTER — Ambulatory Visit (HOSPITAL_COMMUNITY)
Admission: RE | Admit: 2019-09-15 | Discharge: 2019-09-15 | Disposition: A | Discharge status: Home / Self Care | Payer: BC Managed Care – PPO | Source: Ambulatory Visit | Attending: Family Medicine | Admitting: Family Medicine

## 2019-09-15 ENCOUNTER — Other Ambulatory Visit (HOSPITAL_COMMUNITY): Payer: Self-pay | Admitting: Family Medicine

## 2019-09-15 DIAGNOSIS — M25559 Pain in unspecified hip: Secondary | ICD-10-CM

## 2019-09-16 ENCOUNTER — Ambulatory Visit (INDEPENDENT_AMBULATORY_CARE_PROVIDER_SITE_OTHER): Payer: BC Managed Care – PPO | Admitting: Cardiology

## 2019-09-16 ENCOUNTER — Encounter: Payer: Self-pay | Admitting: Cardiology

## 2019-09-16 VITALS — BP 103/72 | HR 86 | Ht 74.0 in | Wt 246.4 lb

## 2019-09-16 DIAGNOSIS — E785 Hyperlipidemia, unspecified: Secondary | ICD-10-CM | POA: Diagnosis not present

## 2019-09-16 DIAGNOSIS — R0600 Dyspnea, unspecified: Secondary | ICD-10-CM | POA: Diagnosis not present

## 2019-09-16 DIAGNOSIS — I208 Other forms of angina pectoris: Secondary | ICD-10-CM

## 2019-09-16 DIAGNOSIS — R079 Chest pain, unspecified: Secondary | ICD-10-CM | POA: Diagnosis not present

## 2019-09-16 DIAGNOSIS — Z01810 Encounter for preprocedural cardiovascular examination: Secondary | ICD-10-CM

## 2019-09-16 DIAGNOSIS — R0609 Other forms of dyspnea: Secondary | ICD-10-CM

## 2019-09-16 DIAGNOSIS — I10 Essential (primary) hypertension: Secondary | ICD-10-CM | POA: Diagnosis not present

## 2019-09-16 DIAGNOSIS — I2089 Other forms of angina pectoris: Secondary | ICD-10-CM

## 2019-09-16 DIAGNOSIS — I2510 Atherosclerotic heart disease of native coronary artery without angina pectoris: Secondary | ICD-10-CM

## 2019-09-16 NOTE — Patient Instructions (Addendum)
Medication Instructions:  Your physician recommends that you continue on your current medications as directed. Please refer to the Current Medication list given to you today.  *If you need a refill on your cardiac medications before your next appointment, please call your pharmacy*  Lab Work: Your physician recommends that you return for lab work in:  BMET  [place] will need to have a COVID test prior to your heart catheterization. This test is performed at [address] [place], [place] [address]  If you have labs (blood work) drawn today and your tests are completely normal, you will receive your results only by: Marland Kitchen MyChart Message (if you have MyChart) OR . A paper copy in the mail If you have any lab test that is abnormal or we need to change your treatment, we will call you to review the results.  Testing/Procedures: Your physician has requested that you have a cardiac catheterization. Cardiac catheterization is used to diagnose and/or treat various heart conditions. Doctors may recommend this procedure for a number of different reasons. The most common reason is to evaluate chest pain. Chest pain can be a symptom of coronary artery disease (CAD), and cardiac catheterization can show whether plaque is narrowing or blocking your heart's arteries. This procedure is also used to evaluate the valves, as well as measure the blood flow and oxygen levels in different parts of your heart. For further information please visit HugeFiesta.tn. Please follow instruction sheet, as given.   Follow-Up: At [facility], you and your health needs are our priority.  As part of our continuing mission to provide you with exceptional heart care, we have created designated Provider Care Teams.  These Care Teams include your primary Cardiologist (physician) and Advanced Practice Providers (APPs -  Physician Assistants and Nurse Practitioners) who all work together to provide you with the care you need,  when you need it.    Your next appointment:   2-3 week(s) after Heart Catheterization  The format for your next appointment:   In Person  Provider:   Glenetta Hew, MD   Other Instructions      [place] [address] Dept: [phone] Loc: [place]  09/16/2019  You are scheduled for a Cardiac Catheterization on Wednesday, August 4 with Dr. Glenetta Hew.  1. Please arrive at the [facility] (Main Entrance A) at [facility]: [address] [place], [place] [address] at 9:30 AM (This time is two hours before your procedure to ensure your preparation). Free valet parking service is available.   Special note: Every effort is made to have your procedure done on time. Please understand that emergencies sometimes delay scheduled procedures.  2. Diet: Do not eat solid foods after midnight.  The patient may have clear liquids until 5am upon the day of the procedure.  3. Labs: You will need to have blood drawn on Monday, August 2 at [facility]  Open: 8am - 5pm (Lunch 12:30 - 1:30)   Phone: [phone]. You do not need to be fasting.  4. Medication instructions in preparation for your procedure:   Contrast Allergy: No  On the morning of your procedure, take your Aspirin and any morning medicines NOT listed above.  You may use sips of water.  5. Plan for one night stay--bring personal belongings. 6. Bring a current list of your medications and current  insurance cards. 7. You MUST have a responsible person to drive you home. 8. Someone MUST be with you the first 24 hours after you arrive home or your discharge will be delayed. 9. Please wear clothes that are easy to get on and off and wear slip-on shoes.  Thank you for allowing [place] to care for you!   -- [facility]
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
## 2019-09-17 ENCOUNTER — Other Ambulatory Visit (HOSPITAL_COMMUNITY)
Admission: RE | Admit: 2019-09-17 | Discharge: 2019-09-17 | Disposition: A | Discharge status: Home / Self Care | Payer: BC Managed Care – PPO | Source: Ambulatory Visit | Attending: Cardiology | Admitting: Cardiology

## 2019-09-17 DIAGNOSIS — Z20822 Contact with and (suspected) exposure to covid-19: Secondary | ICD-10-CM | POA: Insufficient documentation

## 2019-09-17 DIAGNOSIS — Z01812 Encounter for preprocedural laboratory examination: Secondary | ICD-10-CM | POA: Insufficient documentation

## 2019-09-17 LAB — SARS CORONAVIRUS 2 (TAT 6-24 HRS): SARS Coronavirus 2: NEGATIVE

## 2019-09-18 ENCOUNTER — Encounter: Payer: Self-pay | Admitting: Cardiology

## 2019-09-18 NOTE — Assessment & Plan Note (Signed)
We may actually be adjusting the target LDL down to below 70 based on findings on upcoming cardiac catheterization.  Levels have not improved, would therefore strongly consider adding statin.

## 2019-09-18 NOTE — Assessment & Plan Note (Signed)
At this point I think exertional chest pain must be considered class III-IV progressive angina until proven otherwise.  Less likely by the fact that he had a coronary CTA done just over a year and a half ago showing no significant CAD. However now with new onset recurrent symptoms, this could be a crescendo progressive angina condition.  Plan is to proceed with cardiac catheterization and possible PCI.

## 2019-09-18 NOTE — Assessment & Plan Note (Signed)
He does have some grade 2 diastolic dysfunction on echo, but his blood pressure is quite low indicating that is pretty well controlled. I suspect with new onset exertional dyspnea has been a change in anatomy at some point. Cannot exclude new ischemic CAD.  We will plan cardiac catheterization.

## 2019-09-18 NOTE — Assessment & Plan Note (Signed)
Atypical progressive angina. Based on the level of exertion required to get angina, it would be at least class III if not class IV. There are some clear atypical features at that it is somewhat positional, and the location is not classic.  Typical features being the fact that there is a definite exertional component and associated with extreme exertional dyspnea.

## 2019-09-18 NOTE — Assessment & Plan Note (Signed)
Pressure actually well controlled on ARB. We may need to reduce the dose in order to potentially add beta-blocker depending on Findings.

## 2019-09-18 NOTE — Assessment & Plan Note (Signed)
He clearly has coronary artery atherosclerosis based on his coronary CTA back in 2020. Despite this, he has not been on any treatment for his cholesterol, and cholesterol levels are poorly controlled.  He now has recurrence of somewhat limiting exertional dyspnea and chest tightness/pressure concerning for Progressive Class III-IV angina.  Plan: At this point with him having significant symptoms the best course of action is to proceed with cardiac catheterization for definitive evaluation. We discussed options of stress test, repeat coronary CTA versus cath. Given the potential of diagnosis and treatment in the same setting, [place] is eager to get an answer and treat if possible.  Plan: Cardiac catheterization with possible PCI  Pending results, will probably need to initiate treatment of lipids.  Blood pressure is well controlled on ARB which theoretically we could reduce the dose (potentially to add beta-blocker if PCI indicated)

## 2019-09-19 ENCOUNTER — Telehealth: Payer: Self-pay | Admitting: Cardiology

## 2019-09-19 NOTE — Telephone Encounter (Signed)
Left message for patient to call and schedule post cath visit (cath scheduled 09/21/19--visit needs to be 2-3 weeks after procedure)

## 2019-09-20 ENCOUNTER — Telehealth: Payer: Self-pay | Admitting: *Deleted

## 2019-09-20 LAB — BASIC METABOLIC PANEL
BUN/Creatinine Ratio: 15 (ref 9–20)
BUN: 16 mg/dL (ref 6–24)
CO2: 19 mmol/L — ABNORMAL LOW (ref 20–29)
Calcium: 9.2 mg/dL (ref 8.7–10.2)
Chloride: 101 mmol/L (ref 96–106)
Creatinine, Ser: 1.04 mg/dL (ref 0.76–1.27)
GFR calc Af Amer: 92 mL/min/{1.73_m2} (ref >59)
GFR calc non Af Amer: 80 mL/min/{1.73_m2} (ref >59)
Glucose: 97 mg/dL (ref 65–99)
Potassium: 4.5 mmol/L (ref 3.5–5.2)
Sodium: 135 mmol/L (ref 134–144)

## 2019-09-20 LAB — CBC
Hematocrit: 36.4 % — ABNORMAL LOW (ref 37.5–51.0)
Hemoglobin: 12.4 g/dL — ABNORMAL LOW (ref 13.0–17.7)
MCH: 30.5 pg (ref 26.6–33.0)
MCHC: 34.1 g/dL (ref 31.5–35.7)
MCV: 89 fL (ref 79–97)
Platelets: 226 10*3/uL (ref 150–450)
RBC: 4.07 x10E6/uL — ABNORMAL LOW (ref 4.14–5.80)
RDW: 13.8 % (ref 11.6–15.4)
WBC: 9.8 10*3/uL (ref 3.4–10.8)

## 2019-09-20 NOTE — Telephone Encounter (Signed)
Pt contacted pre-catheterization scheduled at [facility] for: Wednesday September 21, 2019 11:30 AM Verified arrival time and place: [facility]) at: 9:30 AM   No solid food after midnight prior to cath, clear liquids until 5 AM day of procedure.   AM meds can be  taken pre-cath with sips of water including: ASA 81 mg   Confirmed patient has responsible adult to drive home post procedure and observe 24 hours after arriving home: yes  You are allowed ONE visitor in the waiting room during your procedure. Both you and your visitor must wear a mask once you enter the hospital.      COVID-19 Pre-Screening Questions:   In the past 14 days have you had a new cough associated with shortness of breath, fever (100.4 or greater) or sudden loss of taste or sense of smell? no  In the past 14 days have you been around anyone with known Covid 19? No  Any international travel in the past 14 days? no  Have you been vaccinated for COVID-19? Yes, see immunization history   Reviewed procedure/mask/visitor instructions, COVID-19 questions with patient's wife (DPR), Mechelle.

## 2019-09-21 ENCOUNTER — Encounter (HOSPITAL_COMMUNITY)
Admission: RE | Disposition: A | Discharge status: Home / Self Care | Payer: Self-pay | Source: Home / Self Care | Attending: Cardiology

## 2019-09-21 ENCOUNTER — Ambulatory Visit (HOSPITAL_COMMUNITY)
Admission: RE | Admit: 2019-09-21 | Discharge: 2019-09-21 | Disposition: A | Discharge status: Home / Self Care | Payer: BC Managed Care – PPO | Attending: Cardiology | Admitting: Cardiology

## 2019-09-21 ENCOUNTER — Other Ambulatory Visit: Payer: Self-pay

## 2019-09-21 DIAGNOSIS — I208 Other forms of angina pectoris: Secondary | ICD-10-CM | POA: Diagnosis not present

## 2019-09-21 DIAGNOSIS — I2510 Atherosclerotic heart disease of native coronary artery without angina pectoris: Secondary | ICD-10-CM | POA: Diagnosis present

## 2019-09-21 DIAGNOSIS — E669 Obesity, unspecified: Secondary | ICD-10-CM | POA: Diagnosis not present

## 2019-09-21 DIAGNOSIS — I10 Essential (primary) hypertension: Secondary | ICD-10-CM | POA: Diagnosis not present

## 2019-09-21 DIAGNOSIS — I25119 Atherosclerotic heart disease of native coronary artery with unspecified angina pectoris: Secondary | ICD-10-CM | POA: Diagnosis present

## 2019-09-21 DIAGNOSIS — M109 Gout, unspecified: Secondary | ICD-10-CM | POA: Insufficient documentation

## 2019-09-21 DIAGNOSIS — R0609 Other forms of dyspnea: Secondary | ICD-10-CM | POA: Diagnosis not present

## 2019-09-21 DIAGNOSIS — Z6831 Body mass index (BMI) 31.0-31.9, adult: Secondary | ICD-10-CM | POA: Diagnosis not present

## 2019-09-21 DIAGNOSIS — Z79899 Other long term (current) drug therapy: Secondary | ICD-10-CM | POA: Diagnosis not present

## 2019-09-21 HISTORY — PX: LEFT HEART CATH AND CORONARY ANGIOGRAPHY: CATH118249

## 2019-09-21 SURGERY — LEFT HEART CATH AND CORONARY ANGIOGRAPHY
Anesthesia: LOCAL

## 2019-09-21 MED ORDER — SODIUM CHLORIDE 0.9% FLUSH: 3.0000 mL | INTRAVENOUS | Status: DC | PRN

## 2019-09-21 MED ORDER — HEPARIN (PORCINE) IN NACL 1000-0.9 UT/500ML-% IV SOLN
INTRAVENOUS | Status: DC | PRN
  Administered 2019-09-21 (×2): 500 mL

## 2019-09-21 MED ORDER — MIDAZOLAM HCL 2 MG/2ML IJ SOLN
INTRAMUSCULAR | Status: AC
  Filled 2019-09-21: qty 2

## 2019-09-21 MED ORDER — SODIUM CHLORIDE 0.9% FLUSH: 3.0000 mL | Freq: Two times a day (BID) | INTRAVENOUS | Status: DC

## 2019-09-21 MED ORDER — SODIUM CHLORIDE 0.9 % IV SOLN: INTRAVENOUS | Status: DC

## 2019-09-21 MED ORDER — HEPARIN (PORCINE) IN NACL 1000-0.9 UT/500ML-% IV SOLN
INTRAVENOUS | Status: AC
  Filled 2019-09-21: qty 1000

## 2019-09-21 MED ORDER — IOHEXOL 350 MG/ML SOLN
INTRAVENOUS | Status: DC | PRN
  Administered 2019-09-21: 55 mL

## 2019-09-21 MED ORDER — SODIUM CHLORIDE 0.9 % WEIGHT BASED INFUSION
3.0000 mL/kg/h | INTRAVENOUS | Status: DC
  Administered 2019-09-21: 3 mL/kg/h via INTRAVENOUS

## 2019-09-21 MED ORDER — SODIUM CHLORIDE 0.9 % IV SOLN: 250.0000 mL | INTRAVENOUS | Status: DC | PRN

## 2019-09-21 MED ORDER — LABETALOL HCL 5 MG/ML IV SOLN: 10.0000 mg | INTRAVENOUS | Status: DC | PRN

## 2019-09-21 MED ORDER — LIDOCAINE HCL (PF) 1 % IJ SOLN
INTRAMUSCULAR | Status: DC | PRN
  Administered 2019-09-21: 4 mL

## 2019-09-21 MED ORDER — ASPIRIN 81 MG PO CHEW: 81.0000 mg | CHEWABLE_TABLET | ORAL | Status: DC

## 2019-09-21 MED ORDER — ONDANSETRON HCL 4 MG/2ML IJ SOLN: 4.0000 mg | Freq: Four times a day (QID) | INTRAMUSCULAR | Status: DC | PRN

## 2019-09-21 MED ORDER — VERAPAMIL HCL 2.5 MG/ML IV SOLN
INTRAVENOUS | Status: AC
  Filled 2019-09-21: qty 2

## 2019-09-21 MED ORDER — HEPARIN SODIUM (PORCINE) 1000 UNIT/ML IJ SOLN
INTRAMUSCULAR | Status: DC | PRN
  Administered 2019-09-21: 6000 [IU] via INTRAVENOUS

## 2019-09-21 MED ORDER — MORPHINE SULFATE (PF) 2 MG/ML IV SOLN: 2.0000 mg | INTRAVENOUS | Status: DC | PRN

## 2019-09-21 MED ORDER — ACETAMINOPHEN 325 MG PO TABS: 650.0000 mg | ORAL_TABLET | ORAL | Status: DC | PRN

## 2019-09-21 MED ORDER — HEPARIN SODIUM (PORCINE) 1000 UNIT/ML IJ SOLN
INTRAMUSCULAR | Status: AC
  Filled 2019-09-21: qty 1

## 2019-09-21 MED ORDER — SODIUM CHLORIDE 0.9 % WEIGHT BASED INFUSION: 1.0000 mL/kg/h | INTRAVENOUS | Status: DC

## 2019-09-21 MED ORDER — MIDAZOLAM HCL 2 MG/2ML IJ SOLN
INTRAMUSCULAR | Status: DC | PRN
  Administered 2019-09-21: 2 mg via INTRAVENOUS

## 2019-09-21 MED ORDER — LIDOCAINE HCL (PF) 1 % IJ SOLN
INTRAMUSCULAR | Status: AC
  Filled 2019-09-21: qty 30

## 2019-09-21 MED ORDER — VERAPAMIL HCL 2.5 MG/ML IV SOLN: INTRAVENOUS | Status: DC | PRN

## 2019-09-21 MED ORDER — HYDRALAZINE HCL 20 MG/ML IJ SOLN: 10.0000 mg | INTRAMUSCULAR | Status: DC | PRN

## 2019-09-21 MED ORDER — FENTANYL CITRATE (PF) 100 MCG/2ML IJ SOLN
INTRAMUSCULAR | Status: DC | PRN
  Administered 2019-09-21: 50 ug via INTRAVENOUS

## 2019-09-21 MED ORDER — FENTANYL CITRATE (PF) 100 MCG/2ML IJ SOLN
INTRAMUSCULAR | Status: AC
  Filled 2019-09-21: qty 2

## 2019-09-21 SURGICAL SUPPLY — 10 items
CATH OPTITORQUE TIG 4.0 5F (CATHETERS) ×4 IMPLANT
DEVICE RAD COMP TR BAND LRG (VASCULAR PRODUCTS) ×2 IMPLANT
GLIDESHEATH SLEND A-KIT 6F 22G (SHEATH) ×2 IMPLANT
GUIDEWIRE INQWIRE 1.5J.035X260 (WIRE) ×1 IMPLANT
INQWIRE 1.5J .035X260CM (WIRE) ×2
KIT HEART LEFT (KITS) ×2 IMPLANT
PACK CARDIAC CATHETERIZATION (CUSTOM PROCEDURE TRAY) ×2 IMPLANT
SHEATH PROBE COVER 6X72 (BAG) ×2 IMPLANT
TRANSDUCER W/STOPCOCK (MISCELLANEOUS) ×2 IMPLANT
TUBING CIL FLEX 10 FLL-RA (TUBING) ×2 IMPLANT

## 2019-09-21 NOTE — Discharge Instructions (Signed)
Drink plenty of fluids for 48 hours and keep wrist elevated at heart level for 24 hours  Radial Site Care   This sheet gives you information about how to care for yourself after your procedure. Your health care provider may also give you more specific instructions. If you have problems or questions, contact your health care provider. What can I expect after the procedure? After the procedure, it is common to have:  Bruising and tenderness at the catheter insertion area. Follow these instructions at home: Medicines  Take over-the-counter and prescription medicines only as told by your health care provider. Insertion site care 1. Follow instructions from your health care provider about how to take care of your insertion site. Make sure you: ? Wash your hands with soap and water before you change your bandage (dressing). If soap and water are not available, use hand sanitizer. ? Remove your dressing as told by your health care provider. In 24 hours 2. Check your insertion site every day for signs of infection. Check for: ? Redness, swelling, or pain. ? Fluid or blood. ? Pus or a bad smell. ? Warmth. 3. Do not take baths, swim, or use a hot tub until your health care provider approves. 4. You may shower 24-48 hours after the procedure, or as directed by your health care provider. ? Remove the dressing and gently wash the site with plain soap and water. ? Pat the area dry with a clean towel. ? Do not rub the site. That could cause bleeding. 5. Do not apply powder or lotion to the site. Activity   1. For 24 hours after the procedure, or as directed by your health care provider: ? Do not flex or bend the affected arm. ? Do not push or pull heavy objects with the affected arm. ? Do not drive yourself home from the hospital or clinic. You may drive 24 hours after the procedure unless your health care provider tells you not to. ? Do not operate machinery or power tools. 2. Do not lift  anything that is heavier than 10 lb (4.5 kg), or the limit that you are told, until your health care provider says that it is safe.  For 4 days 3. Ask your health care provider when it is okay to: ? Return to work or school. ? Resume usual physical activities or sports. ? Resume sexual activity. General instructions  If the catheter site starts to bleed, raise your arm and put firm pressure on the site. If the bleeding does not stop, get help right away. This is a medical emergency.  If you went home on the same day as your procedure, a responsible adult should be with you for the first 24 hours after you arrive home.  Keep all follow-up visits as told by your health care provider. This is important. Contact a health care provider if:  You have a fever.  You have redness, swelling, or yellow drainage around your insertion site. Get help right away if:  You have unusual pain at the radial site.  The catheter insertion area swells very fast.  The insertion area is bleeding, and the bleeding does not stop when you hold steady pressure on the area.  Your arm or hand becomes pale, cool, tingly, or numb. These symptoms may represent a serious problem that is an emergency. Do not wait to see if the symptoms will go away. Get medical help right away. Call your local emergency services (911 in the [place] Do   not drive yourself to the hospital. Summary  After the procedure, it is common to have bruising and tenderness at the site.  Follow instructions from your health care provider about how to take care of your radial site wound. Check the wound every day for signs of infection.  Do not lift anything that is heavier than 10 lb (4.5 kg), or the limit that you are told, until your health care provider says that it is safe. This information is not intended to replace advice given to you by your health care provider. Make sure you discuss any questions you have with your health care  provider. Document Revised: 03/11/2017 Document Reviewed: 03/11/2017 Elsevier Patient Education  2020 [employer]  

## 2019-09-21 NOTE — Interval H&P Note (Signed)
History and Physical Interval Note:  09/21/2019 11:15 AM  Hunter Ray  has presented today for surgery, with the diagnosis of Angina.  The various methods of treatment have been discussed with the patient and family. After consideration of risks, benefits and other options for treatment, the patient has consented to  Procedure(s): LEFT HEART CATH AND CORONARY ANGIOGRAPHY (N/A)  PERCUTANEOUS CORONARY INTERVENTION  as a surgical intervention.  The patient's history has been reviewed, patient examined, no change in status, stable for surgery.  I have reviewed the patient's chart and labs.  Questions were answered to the patient's satisfaction.    Cath Lab Visit (complete for each Cath Lab visit)  Clinical Evaluation Leading to the Procedure:   ACS: No.  Non-ACS:    Anginal Classification: CCS III  Anti-ischemic medical therapy: Minimal Therapy (1 class of medications)  Non-Invasive Test Results: No non-invasive testing performed  Prior CABG: No previous CABG   Glenetta Hew

## 2019-09-22 ENCOUNTER — Encounter (HOSPITAL_COMMUNITY): Payer: Self-pay | Admitting: Cardiology

## 2019-10-13 ENCOUNTER — Other Ambulatory Visit: Payer: Self-pay

## 2019-10-13 ENCOUNTER — Ambulatory Visit (INDEPENDENT_AMBULATORY_CARE_PROVIDER_SITE_OTHER): Payer: BC Managed Care – PPO | Admitting: Cardiology

## 2019-10-13 ENCOUNTER — Encounter: Payer: Self-pay | Admitting: Cardiology

## 2019-10-13 VITALS — BP 120/74 | HR 88 | Ht 74.0 in | Wt 237.6 lb

## 2019-10-13 DIAGNOSIS — E785 Hyperlipidemia, unspecified: Secondary | ICD-10-CM

## 2019-10-13 DIAGNOSIS — I10 Essential (primary) hypertension: Secondary | ICD-10-CM

## 2019-10-13 DIAGNOSIS — R0600 Dyspnea, unspecified: Secondary | ICD-10-CM | POA: Diagnosis not present

## 2019-10-13 DIAGNOSIS — I2510 Atherosclerotic heart disease of native coronary artery without angina pectoris: Secondary | ICD-10-CM | POA: Diagnosis not present

## 2019-10-13 DIAGNOSIS — R0609 Other forms of dyspnea: Secondary | ICD-10-CM

## 2019-10-13 DIAGNOSIS — R079 Chest pain, unspecified: Secondary | ICD-10-CM

## 2019-10-13 NOTE — Patient Instructions (Signed)
Medication Instructions:  No changes  *If you need a refill on your cardiac medications before your next appointment, please call your pharmacy*   Lab Work: Not needed    Testing/Procedures: Will be schedule at [facility] hospital   will needed to have covid test 3 days prior to testing-  Your physician has recommended that you have a cardiopulmonary stress test (CPX). CPX testing is a non-invasive measurement of heart and lung function. It replaces a traditional treadmill stress test. This type of test provides a tremendous amount of information that relates not only to your present condition but also for future outcomes. This test combines measurements of you ventilation, respiratory gas exchange in the lungs, electrocardiogram (EKG), blood pressure and physical response before, during, and following an exercise protocol.    Follow-Up: At [facility], you and your health needs are our priority.  As part of our continuing mission to provide you with exceptional heart care, we have created designated Provider Care Teams.  These Care Teams include your primary Cardiologist (physician) and Advanced Practice Providers (APPs -  Physician Assistants and Nurse Practitioners) who all work together to provide you with the care you need, when you need it.    Your next appointment:   1 month(s)  The format for your next appointment:   Virtual Visit - after  cpx  Provider:   Glenetta Hew, MD   Other Instructions
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
## 2019-10-14 ENCOUNTER — Telehealth: Payer: Self-pay | Admitting: Cardiology

## 2019-10-14 NOTE — Telephone Encounter (Signed)
Spoke with patient regarding CPX appointment scheduled 11/03/19 at 9:30 am--COVID screening Monday 10/31/19 at 8:35am.--Virtual follow up with Dr. Ellyn Hack Friday 11/11/19 at 8:40 am  Will mail the information to patient.  He voiced his understanding

## 2019-10-14 NOTE — Telephone Encounter (Signed)
Left message for patient to call and discuss scheduling CPX ordered by Dr. Ellyn Hack

## 2019-10-16 ENCOUNTER — Encounter: Payer: Self-pay | Admitting: Cardiology

## 2019-10-17 ENCOUNTER — Encounter: Payer: Self-pay | Admitting: Cardiology

## 2019-10-17 NOTE — Assessment & Plan Note (Signed)
Not really having any more this pain.  Sounds more like it is related to certain movements as opposed to exercise.  Nonanginal pain.

## 2019-10-17 NOTE — Assessment & Plan Note (Addendum)
Results primus confirmed findings on coronary CTA.  There is some mild disease which goes along with a coronary calcium score of 130.  But nonobstructive disease noted.  This would not explain his chest pain.  There is still mild possibility of microvascular disease, however that would be diagnosis of exclusion.  Plan: CPX to determine cardiac versus pulmonary disease.  Continue to recommend lipid monitoring with an LDL goal of less than 100, closer to 70--currently there  Blood pressure control, currently on ARB.  Will hold off on beta-blocker for now.  Pending results of CPX, may consider co-Q10 plus/minus addition of beta-blocker or calcium channel blocker.

## 2019-10-17 NOTE — Assessment & Plan Note (Signed)
Well-controlled on ARB.

## 2019-10-17 NOTE — Assessment & Plan Note (Addendum)
Currently at goal on no medications. Continue to encourage appropriate nutrition

## 2019-10-17 NOTE — Assessment & Plan Note (Signed)
He has had a pretty negative cardiac evaluation now for these symptoms.  In the past he had an echocardiogram that was relatively normal following coronary CTA and now heart catheterization.  Relatively normal filling pressures on cath as well.  At this point, his dyspnea could be related to deconditioning and obesity, but we will need to exclude possibility of pulmonary versus microvascular cardiac disease.  Plan: CPX (cardiopulmonary stress test

## 2019-10-31 ENCOUNTER — Other Ambulatory Visit (HOSPITAL_COMMUNITY)
Admission: RE | Admit: 2019-10-31 | Discharge: 2019-10-31 | Disposition: A | Discharge status: Home / Self Care | Payer: BC Managed Care – PPO | Source: Ambulatory Visit | Attending: Cardiology | Admitting: Cardiology

## 2019-10-31 DIAGNOSIS — Z01812 Encounter for preprocedural laboratory examination: Secondary | ICD-10-CM | POA: Diagnosis present

## 2019-10-31 DIAGNOSIS — Z20822 Contact with and (suspected) exposure to covid-19: Secondary | ICD-10-CM | POA: Diagnosis not present

## 2019-10-31 LAB — SARS CORONAVIRUS 2 (TAT 6-24 HRS): SARS Coronavirus 2: NEGATIVE

## 2019-11-03 ENCOUNTER — Other Ambulatory Visit: Payer: Self-pay

## 2019-11-03 ENCOUNTER — Ambulatory Visit (HOSPITAL_COMMUNITY): Payer: BC Managed Care – PPO | Attending: Cardiology

## 2019-11-03 DIAGNOSIS — R0600 Dyspnea, unspecified: Secondary | ICD-10-CM

## 2019-11-03 DIAGNOSIS — I25118 Atherosclerotic heart disease of native coronary artery with other forms of angina pectoris: Secondary | ICD-10-CM | POA: Insufficient documentation

## 2019-11-03 DIAGNOSIS — R0609 Other forms of dyspnea: Secondary | ICD-10-CM

## 2019-11-03 DIAGNOSIS — R079 Chest pain, unspecified: Secondary | ICD-10-CM

## 2019-11-03 DIAGNOSIS — I2510 Atherosclerotic heart disease of native coronary artery without angina pectoris: Secondary | ICD-10-CM

## 2019-11-07 ENCOUNTER — Telehealth: Payer: Self-pay | Admitting: *Deleted

## 2019-11-07 NOTE — Telephone Encounter (Signed)
°  Patient Consent for Virtual Visit          Hunter Ray has provided verbal consent on 11/07/2019 for a virtual visit (video or telephone).   CONSENT FOR VIRTUAL VISIT FOR:  Hunter Ray  By participating in this virtual visit I agree to the following:  I hereby voluntarily request, consent and authorize [facility] and its employed or contracted physicians, physician assistants, nurse practitioners or other licensed health care professionals (the Practitioner), to provide me with telemedicine health care services (the Services") as deemed necessary by the treating Practitioner. I acknowledge and consent to receive the Services by the Practitioner via telemedicine. I understand that the telemedicine visit will involve communicating with the Practitioner through live audiovisual communication technology and the disclosure of certain medical information by electronic transmission. I acknowledge that I have been given the opportunity to request an in-person assessment or other available alternative prior to the telemedicine visit and am voluntarily participating in the telemedicine visit.  I understand that I have the right to withhold or withdraw my consent to the use of telemedicine in the course of my care at any time, without affecting my right to future care or treatment, and that the Practitioner or I may terminate the telemedicine visit at any time. I understand that I have the right to inspect all information obtained and/or recorded in the course of the telemedicine visit and may receive copies of available information for a reasonable fee.  I understand that some of the potential risks of receiving the Services via telemedicine include:   Delay or interruption in medical evaluation due to technological equipment failure or disruption;  Information transmitted may not be sufficient (e.g. poor resolution of images) to allow for appropriate medical decision making by the  Practitioner; and/or   In rare instances, security protocols could fail, causing a breach of personal health information.  Furthermore, I acknowledge that it is my responsibility to provide information about my medical history, conditions and care that is complete and accurate to the best of my ability. I acknowledge that Practitioner's advice, recommendations, and/or decision may be based on factors not within their control, such as incomplete or inaccurate data provided by me or distortions of diagnostic images or specimens that may result from electronic transmissions. I understand that the practice of medicine is not an exact science and that Practitioner makes no warranties or guarantees regarding treatment outcomes. I acknowledge that a copy of this consent can be made available to me via my patient portal ([facility]), or I can request a printed copy by calling the office of [facility].    I understand that my insurance will be billed for this visit.   I have read or had this consent read to me.  I understand the contents of this consent, which adequately explains the benefits and risks of the Services being provided via telemedicine.   I have been provided ample opportunity to ask questions regarding this consent and the Services and have had my questions answered to my satisfaction.  I give my informed consent for the services to be provided through the use of telemedicine in my medical care

## 2019-11-11 ENCOUNTER — Encounter: Payer: Self-pay | Admitting: Cardiology

## 2019-11-11 ENCOUNTER — Telehealth (INDEPENDENT_AMBULATORY_CARE_PROVIDER_SITE_OTHER): Payer: BC Managed Care – PPO | Admitting: Cardiology

## 2019-11-11 DIAGNOSIS — E785 Hyperlipidemia, unspecified: Secondary | ICD-10-CM | POA: Diagnosis not present

## 2019-11-11 DIAGNOSIS — I2510 Atherosclerotic heart disease of native coronary artery without angina pectoris: Secondary | ICD-10-CM

## 2019-11-11 DIAGNOSIS — I10 Essential (primary) hypertension: Secondary | ICD-10-CM
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
## 2019-11-11 NOTE — Assessment & Plan Note (Signed)
Lipids remain under control without medications.  Hopefully with weight loss and exercise, lipids will improve.  Target remains less than 100 if not close to 70.

## 2019-11-11 NOTE — Patient Instructions (Addendum)
Medication Instructions:  none *If you need a refill on your cardiac medications before your next appointment, please call your pharmacy*   Lab Work: non3eIf you have labs (blood work) drawn today and your tests are completely normal, you will receive your results only by: Marland Kitchen MyChart Message (if you have MyChart) OR . A paper copy in the mail If you have any lab test that is abnormal or we need to change your treatment, we will call you to review the results.   Testing/Procedures: none   Follow-Up: At [facility], you and your health needs are our priority.  As part of our continuing mission to provide you with exceptional heart care, we have created designated Provider Care Teams.  These Care Teams include your primary Cardiologist (physician) and Advanced Practice Providers (APPs -  Physician Assistants and Nurse Practitioners) who all work together to provide you with the care you need, when you need it.  We recommend signing up for the patient portal called "MyChart".  Sign up information is provided on this After Visit Summary.  MyChart is used to connect with patients for Virtual Visits (Telemedicine).  Patients are able to view lab/test results, encounter notes, upcoming appointments, etc.  Non-urgent messages can be sent to your provider as well.   To learn more about what you can do with MyChart, go to [email or web address].    Your next appointment:   As Needed if withing 3 years - remain active as a patient @ [place]  The format for your next appointment:   If > 1 yr - preferred in person  Provider:   You may see Glenetta Hew, MD or one of the following Advanced Practice Providers on your designated Care Team:    Rosaria Ferries, PA-C  Jory Sims, DNP, ANP    Other Instructions Keep exercising Your physician wants you to follow-up in: 1 year as needed.  You will receive a reminder letter in the mail two months in advance. If you don't receive a  letter, please call our office to schedule the follow-up appointment.

## 2019-11-11 NOTE — Assessment & Plan Note (Signed)
Nonocclusive CAD by coronary CTA initially and then confirmed by cardiac catheterization.  CPX also does not show any signs of cardiopulmonary disease.  Would suggest deconditioning and obesity.  Thankfully, his exertional dyspnea has improved.  Plan continue respect modification with blood pressure and lipid control. Continue exercise and weight loss.

## 2019-11-11 NOTE — Assessment & Plan Note (Signed)
Blood pressures are pretty well controlled on ARB.  Continue current dose.
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
## 2020-11-26 ENCOUNTER — Encounter: Payer: Self-pay | Admitting: Podiatry

## 2020-11-26 ENCOUNTER — Ambulatory Visit (INDEPENDENT_AMBULATORY_CARE_PROVIDER_SITE_OTHER): Payer: 59 | Admitting: Podiatry

## 2020-11-26 ENCOUNTER — Other Ambulatory Visit: Payer: Self-pay

## 2020-11-26 DIAGNOSIS — L84 Corns and callosities: Secondary | ICD-10-CM

## 2020-11-26 DIAGNOSIS — M778 Other enthesopathies, not elsewhere classified: Secondary | ICD-10-CM | POA: Diagnosis not present

## 2020-11-26 MED ORDER — TRIAMCINOLONE ACETONIDE 10 MG/ML IJ SUSP
10.0000 mg | Freq: Once | INTRAMUSCULAR | Status: AC
  Administered 2020-11-26: 10 mg

## 2020-11-26 NOTE — Progress Notes (Signed)
Subjective:   Patient ID: Hunter Ray, male   DOB: 60 y.o.   MRN: [id number]   HPI Patient presents stating she is getting inflammation around the head of the bone and states that he feels like he is getting calluses right over left   ROS      Objective:  Physical Exam  Inflammatory capsulitis around the fifth MPJ right with several lesions that are formed right over left foot     Assessment:  Has formed some small lesions with inflammatory capsulitis fifth MPJ right over left     Plan:  H&P reviewed condition sterile prep and did inject the capsule of the right 3 mg Dexasone Kenalog 5 mg Xylocaine advised on debridement techniques and patient will be seen back as needed if symptoms were to recur or persist

## 2021-03-06 ENCOUNTER — Encounter: Payer: Self-pay | Admitting: Cardiology

## 2021-03-06 ENCOUNTER — Ambulatory Visit (INDEPENDENT_AMBULATORY_CARE_PROVIDER_SITE_OTHER): Payer: 59 | Admitting: Cardiology

## 2021-03-06 ENCOUNTER — Other Ambulatory Visit: Payer: Self-pay

## 2021-03-06 VITALS — BP 124/86 | HR 72 | Ht 74.0 in | Wt 261.4 lb

## 2021-03-06 DIAGNOSIS — I10 Essential (primary) hypertension: Secondary | ICD-10-CM

## 2021-03-06 DIAGNOSIS — I2510 Atherosclerotic heart disease of native coronary artery without angina pectoris: Secondary | ICD-10-CM | POA: Diagnosis not present

## 2021-03-06 DIAGNOSIS — E669 Obesity, unspecified: Secondary | ICD-10-CM | POA: Diagnosis not present

## 2021-03-06 DIAGNOSIS — E785 Hyperlipidemia, unspecified: Secondary | ICD-10-CM

## 2021-03-06 DIAGNOSIS — E66811 Obesity, class 1: Secondary | ICD-10-CM | POA: Insufficient documentation
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
## 2021-03-06 NOTE — Patient Instructions (Addendum)
Medication Instructions:   No changes  *If you need a refill on your cardiac medications before your next appointment, please call your pharmacy*   Lab Work: Lipid CMP hgbA1c  If you have labs (blood work) drawn today and your tests are completely normal, you will receive your results only by: [place] (if you have MyChart) OR A paper copy in the mail If you have any lab test that is abnormal or we need to change your treatment, we will call you to review the results.   Testing/Procedures: Not needed   Follow-Up: At [facility], you and your health needs are our priority.  As part of our continuing mission to provide you with exceptional heart care, we have created designated Provider Care Teams.  These Care Teams include your primary Cardiologist (physician) and Advanced Practice Providers (APPs -  Physician Assistants and Nurse Practitioners) who all work together to provide you with the care you need, when you need it.     Your next appointment:   12 month(s)  The format for your next appointment:   In Person  Provider:   Glenetta Hew, MD

## 2021-03-07 LAB — COMPREHENSIVE METABOLIC PANEL
ALT: 28 IU/L (ref 0–44)
AST: 32 IU/L (ref 0–40)
Albumin/Globulin Ratio: 2 (ref 1.2–2.2)
Albumin: 4.7 g/dL (ref 3.8–4.9)
Alkaline Phosphatase: 95 IU/L (ref 44–121)
BUN/Creatinine Ratio: 11 (ref 9–20)
BUN: 11 mg/dL (ref 6–24)
Bilirubin Total: 0.5 mg/dL (ref 0.0–1.2)
CO2: 17 mmol/L — ABNORMAL LOW (ref 20–29)
Calcium: 9.2 mg/dL (ref 8.7–10.2)
Chloride: 106 mmol/L (ref 96–106)
Creatinine, Ser: 1.04 mg/dL (ref 0.76–1.27)
Globulin, Total: 2.4 g/dL (ref 1.5–4.5)
Glucose: 101 mg/dL — ABNORMAL HIGH (ref 70–99)
Potassium: 4.8 mmol/L (ref 3.5–5.2)
Sodium: 139 mmol/L (ref 134–144)
Total Protein: 7.1 g/dL (ref 6.0–8.5)
eGFR: 83 mL/min/{1.73_m2} (ref >59)

## 2021-03-07 LAB — LIPID PANEL
Chol/HDL Ratio: 3.1 ratio (ref 0.0–5.0)
Cholesterol, Total: 200 mg/dL — ABNORMAL HIGH (ref 100–199)
HDL: 64 mg/dL (ref >39)
LDL Chol Calc (NIH): 122 mg/dL — ABNORMAL HIGH (ref 0–99)
Triglycerides: 79 mg/dL (ref 0–149)
VLDL Cholesterol Cal: 14 mg/dL (ref 5–40)

## 2021-03-07 LAB — HEMOGLOBIN A1C
Est. average glucose Bld gHb Est-mCnc: 134 mg/dL
Hgb A1c MFr Bld: 6.3 % — ABNORMAL HIGH (ref 4.8–5.6)

## 2021-04-21 ENCOUNTER — Encounter: Payer: Self-pay | Admitting: Cardiology

## 2021-04-21 NOTE — Assessment & Plan Note (Signed)
Blood pressure is well controlled on Benicar.  No change. ?

## 2021-04-21 NOTE — Assessment & Plan Note (Signed)
The patient understands the need to lose weight with diet and exercise. We have discussed specific strategies for this.  

## 2021-04-21 NOTE — Assessment & Plan Note (Signed)
Nonocclusive coronary disease by coronary CTA, confirmed with cardiac catheterization.Minimal disease. ? ?CPX also argued against cardiopulmonary disease.  Suggested deconditioning and obesity is the main reason for his dyspnea.  Continue encourage exercise and weight loss. ? ?Would like to see LDL less than 100 if not less than 70. ?Needs weight loss. ?Blood pressure is pretty well controlled on the losartan.  Would probably hold off on beta-blocker just to avoid potential concerns with fatigue. ?

## 2021-04-21 NOTE — Progress Notes (Signed)
Cholesterol levels have improved with diet and exercise.   Both total cholesterol and LDL have improved, but not yet at goal.  LDL is 122.  Total cholesterol down to 200.  A1c is 6.3.  With this level improving, I would like to see how it changes after another 4-6 months.  Target LDL still less than 100 so if it has not gotten better by then, would probably consider treatment.  Chemistry panel is pretty stable.  Glucose levels are just a little high.  Normal kidney and liver function. A1c is up to 6.3 from 5.9.  Again this is something that can be followed up with PCP.  Probably not quite at this stage we need to consider treating but close.    Anticipate PCP checking labs this summer --> we can determine treatment options at that time.  Glenetta Hew, MD  Please make sure that this goes to PCP

## 2021-04-21 NOTE — Assessment & Plan Note (Signed)
As above as last year, lipid did not look very good.  She is due for follow-up now to see where he stands. ? ?Last LDL was 140.  Labs checked today showed LDL of 122 with total cholesterol 200. ? ?He has had improvement with diet and exercise.  At this point I think we are safe allowing him to reassess in 6 months to see if he continues to go on the right direction, if not better at that time, would probably consider initiation of therapy with low-dose statin.  He should be following up with PCP who can take care of this. ?

## 2021-04-23 ENCOUNTER — Telehealth: Payer: Self-pay | Admitting: *Deleted

## 2021-04-23 NOTE — Telephone Encounter (Signed)
-----   Message from Leonie Man, MD sent at 04/21/2021  7:13 PM EST ----- ?Cholesterol levels have improved with diet and exercise.   ?Both total cholesterol and LDL have improved, but not yet at goal.  LDL is 122.  Total cholesterol down to 200.  A1c is 6.3. ? ?With this level improving, I would like to see how it changes after another 4-6 months.  Target LDL still less than 100 so if it has not gotten better by then, would probably consider treatment. ? ?Chemistry panel is pretty stable.  Glucose levels are just a little high.  Normal kidney and liver function. ?A1c is up to 6.3 from 5.9.  Again this is something that can be followed up with PCP.  Probably not quite at this stage we need to consider treating but close.  ? ? ?Anticipate PCP checking labs this summer --> we can determine treatment options at that time. ? ?Glenetta Hew, MD ? ?Please make sure that this goes to PCP ? ?

## 2021-04-23 NOTE — Telephone Encounter (Signed)
Left detail message of lab results on voicemail per dpr.  Continue with current medication and change diet and exercise. Result were released to mychart.   Any question may call back  ? Routed  information  primary  Dr [name] ?
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
## 2022-03-05 ENCOUNTER — Encounter: Payer: Self-pay | Admitting: Cardiology

## 2022-03-05 ENCOUNTER — Ambulatory Visit: Payer: 59 | Attending: Cardiology | Admitting: Cardiology

## 2022-03-05 VITALS — BP 134/90 | HR 70 | Ht 74.0 in | Wt 257.4 lb

## 2022-03-05 DIAGNOSIS — I10 Essential (primary) hypertension: Secondary | ICD-10-CM

## 2022-03-05 DIAGNOSIS — I2510 Atherosclerotic heart disease of native coronary artery without angina pectoris: Secondary | ICD-10-CM

## 2022-03-05 DIAGNOSIS — E669 Obesity, unspecified: Secondary | ICD-10-CM

## 2022-03-05 DIAGNOSIS — E785 Hyperlipidemia, unspecified: Secondary | ICD-10-CM

## 2022-03-05 DIAGNOSIS — R0609 Other forms of dyspnea: Secondary | ICD-10-CM

## 2022-03-05 MED ORDER — ROSUVASTATIN CALCIUM 20 MG PO TABS: 20.0000 mg | ORAL_TABLET | Freq: Every day | ORAL | 3 refills | Status: DC

## 2022-03-05 NOTE — Patient Instructions (Addendum)
Medication Instructions:   Start Rosuvastatin 20 mg   For the first 2 weeks take 1/2 tablet daily then increase to 20 mg daily  *If you need a refill on your cardiac medications before your next appointment, please call your pharmacy*   Lab Work: 6 months  - fasting Lipid cmp If you have labs (blood work) drawn today and your tests are completely normal, you will receive your results only by: [place] (if you have MyChart) OR A paper copy in the mail If you have any lab test that is abnormal or we need to change your treatment, we will call you to review the results.   Testing/Procedures:  Not needed  Follow-Up: At [facility], you and your health needs are our priority.  As part of our continuing mission to provide you with exceptional heart care, we have created designated Provider Care Teams.  These Care Teams include your primary Cardiologist (physician) and Advanced Practice Providers (APPs -  Physician Assistants and Nurse Practitioners) who all work together to provide you with the care you need, when you need it.     Your next appointment:   12 month(s)  The format for your next appointment:   In Person  Provider:   Glenetta Hew, MD

## 2022-03-09 ENCOUNTER — Encounter: Payer: Self-pay | Admitting: Cardiology

## 2022-03-09 NOTE — Assessment & Plan Note (Signed)
Clearly related to weight gain and deconditioning.  This was suggested by CPX and confirmed by nonocclusive CAD.  No anginal symptoms.  Simply needs to work on diet and increase exercise.

## 2022-03-09 NOTE — Assessment & Plan Note (Signed)
The patient understands the need to lose weight with diet and exercise. We have discussed specific strategies for this.  

## 2022-03-09 NOTE — Assessment & Plan Note (Addendum)
Nonocclusive CAD by Coronary CTA compartment cardiac catheterization with only minimal disease.  CPX was also not significant-suggested deconditioning as a main reason for dyspnea.. Goal LDL < 100 --> start statin Discussed wgt loss - need to adjust diet & try to figure out exercise routine BP controlled, but DBP is borderline - consider HCTZ in addition to ARB Avoiding Beta Blocker 2/2 fatigue concerns

## 2022-03-09 NOTE — Assessment & Plan Note (Signed)
Goal LDL < 100 --> lipids did not tolerate weight despite recommendation for dietary adjustments and increased exercise.  Clearly was becoming itchy.   Will start low-dose rosuvastatin, and reassess labs in roughly 6 months. -> Start 1/2 tablet for 2 weeks then increase to full tablet.

## 2022-03-09 NOTE — Assessment & Plan Note (Signed)
BP controlled, but DBP is borderline - consider HCTZ in addition to ARB

## 2022-05-29 ENCOUNTER — Ambulatory Visit (HOSPITAL_COMMUNITY)
Admission: RE | Admit: 2022-05-29 | Discharge: 2022-05-29 | Disposition: A | Discharge status: Home / Self Care | Payer: 59 | Source: Ambulatory Visit | Attending: Family Medicine | Admitting: Family Medicine

## 2022-05-29 ENCOUNTER — Other Ambulatory Visit (HOSPITAL_COMMUNITY): Payer: Self-pay | Admitting: Family Medicine

## 2022-05-29 DIAGNOSIS — R1903 Right lower quadrant abdominal swelling, mass and lump: Secondary | ICD-10-CM | POA: Insufficient documentation

## 2022-05-29 DIAGNOSIS — R1031 Right lower quadrant pain: Secondary | ICD-10-CM

## 2022-05-29 MED ORDER — IOHEXOL 300 MG/ML  SOLN
100.0000 mL | Freq: Once | INTRAMUSCULAR | Status: AC | PRN
  Administered 2022-05-29: 100 mL via INTRAVENOUS

## 2022-06-12 ENCOUNTER — Telehealth: Payer: Self-pay | Admitting: *Deleted

## 2022-06-12 NOTE — Telephone Encounter (Signed)
Left message to call back to schedule a tele pre op appt.  ?

## 2022-06-12 NOTE — Telephone Encounter (Signed)
   Pre-operative Risk Assessment    Patient Name: Hunter Ray  DOB: 10-04-1960 MRN: [id number]      Request for Surgical Clearance    Procedure:   RIGHT TOTAL HIP ARTHROPLASTY  Date of Surgery:  Clearance TBD                                 Surgeon:  DR, Weber Cooks Surgeon's Group or Practice Name:  [facility] Phone number:  [phone] [phone] ATTN: KELLY HIGH Fax number:  [phone]   Type of Clearance Requested:   - Medical ; NO MEDICATIONS LISTED AS NEEDING TO BE HELD   Type of Anesthesia:  Spinal   Additional requests/questions:    Elpidio Anis   06/12/2022, 10:33 AM

## 2022-06-12 NOTE — Telephone Encounter (Signed)
   Name: Hunter Ray  DOB: [birth year]  MRN: [id number]  Primary Cardiologist: Bryan Lemma, MD   Preoperative team, please contact this patient and set up a phone call appointment for further preoperative risk assessment. Please obtain consent and complete medication review. Thank you for your help.  I confirm that guidance regarding antiplatelet and oral anticoagulation therapy has been completed and, if necessary, noted below (none requested).    Joylene Grapes, NP 06/12/2022, 12:31 PM [facility]

## 2022-06-13 ENCOUNTER — Telehealth: Payer: Self-pay | Admitting: *Deleted

## 2022-06-13 NOTE — Telephone Encounter (Signed)
Pt returning call

## 2022-06-13 NOTE — Telephone Encounter (Signed)
  Patient Consent for Virtual Visit         Hunter Ray has provided verbal consent on 06/13/2022 for a virtual visit (video or telephone).   CONSENT FOR VIRTUAL VISIT FOR:  Hunter Ray  By participating in this virtual visit I agree to the following:  I hereby voluntarily request, consent and authorize [facility] and its employed or contracted physicians, physician assistants, nurse practitioners or other licensed health care professionals (the Practitioner), to provide me with telemedicine health care services (the "Services") as deemed necessary by the treating Practitioner. I acknowledge and consent to receive the Services by the Practitioner via telemedicine. I understand that the telemedicine visit will involve communicating with the Practitioner through live audiovisual communication technology and the disclosure of certain medical information by electronic transmission. I acknowledge that I have been given the opportunity to request an in-person assessment or other available alternative prior to the telemedicine visit and am voluntarily participating in the telemedicine visit.  I understand that I have the right to withhold or withdraw my consent to the use of telemedicine in the course of my care at any time, without affecting my right to future care or treatment, and that the Practitioner or I may terminate the telemedicine visit at any time. I understand that I have the right to inspect all information obtained and/or recorded in the course of the telemedicine visit and may receive copies of available information for a reasonable fee.  I understand that some of the potential risks of receiving the Services via telemedicine include:  Delay or interruption in medical evaluation due to technological equipment failure or disruption; Information transmitted may not be sufficient (e.g. poor resolution of images) to allow for appropriate medical decision making by the  Practitioner; and/or  In rare instances, security protocols could fail, causing a breach of personal health information.  Furthermore, I acknowledge that it is my responsibility to provide information about my medical history, conditions and care that is complete and accurate to the best of my ability. I acknowledge that Practitioner's advice, recommendations, and/or decision may be based on factors not within their control, such as incomplete or inaccurate data provided by me or distortions of diagnostic images or specimens that may result from electronic transmissions. I understand that the practice of medicine is not an exact science and that Practitioner makes no warranties or guarantees regarding treatment outcomes. I acknowledge that a copy of this consent can be made available to me via my patient portal [facility] MyChart), or I can request a printed copy by calling the office of [facility].    I understand that my insurance will be billed for this visit.   I have read or had this consent read to me. I understand the contents of this consent, which adequately explains the benefits and risks of the Services being provided via telemedicine.  I have been provided ample opportunity to ask questions regarding this consent and the Services and have had my questions answered to my satisfaction. I give my informed consent for the services to be provided through the use of telemedicine in my medical care
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
## 2022-06-19 ENCOUNTER — Ambulatory Visit: Payer: 59 | Attending: Internal Medicine

## 2022-06-19 DIAGNOSIS — Z01810 Encounter for preprocedural cardiovascular examination: Secondary | ICD-10-CM

## 2022-07-24 ENCOUNTER — Other Ambulatory Visit (HOSPITAL_COMMUNITY): Payer: 59

## 2022-08-14 NOTE — Progress Notes (Signed)
Sent message, via epic in basket, requesting orders in epic from surgeon.  

## 2022-08-19 ENCOUNTER — Ambulatory Visit: Payer: Self-pay | Admitting: Emergency Medicine

## 2022-08-19 DIAGNOSIS — G8929 Other chronic pain: Secondary | ICD-10-CM

## 2022-08-19 DIAGNOSIS — M87051 Idiopathic aseptic necrosis of right femur: Secondary | ICD-10-CM

## 2022-08-19 NOTE — Progress Notes (Signed)
Second request for pre op orders in ZOX:WRUE for Hunter Ray
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
## 2022-08-20 NOTE — Patient Instructions (Signed)
DUE TO COVID-19 ONLY TWO VISITORS  (aged 60 and older)  ARE ALLOWED TO COME WITH YOU AND STAY IN THE WAITING ROOM ONLY DURING PRE OP AND PROCEDURE.   **NO VISITORS ARE ALLOWED IN THE SHORT STAY AREA OR RECOVERY ROOM!!**  IF YOU WILL BE ADMITTED INTO THE HOSPITAL YOU ARE ALLOWED ONLY FOUR SUPPORT PEOPLE DURING VISITATION HOURS ONLY (7 AM -8PM)   The support person(s) must pass our screening, gel in and out, and wear a mask at all times, including in the patient's room. Patients must also wear a mask when staff or their support person are in the room. Visitors GUEST BADGE MUST BE WORN VISIBLY  One adult visitor may remain with you overnight and MUST be in the room by 8 P.M.     Your procedure is scheduled on: 09/03/22   Report to [facility] Main Entrance    Report to admitting at : 6:00 AM   Call this number if you have problems the morning of surgery [phone]   Do not eat food :After Midnight.   After Midnight you may have the following liquids until : 5:30  AM DAY OF SURGERY  Water Black Coffee (sugar ok, NO MILK/CREAM OR CREAMERS)  Tea (sugar ok, NO MILK/CREAM OR CREAMERS) regular and decaf                             Plain Jell-O (NO RED)                                           Fruit ices (not with fruit pulp, NO RED)                                     Popsicles (NO RED)                                                                  Juice: apple, WHITE grape, WHITE cranberry Sports drinks like Gatorade (NO RED)   The day of surgery:  Drink ONE (1) Pre-Surgery Clear Ensure at : 5:30 AM the morning of surgery. Drink in one sitting. Do not sip.  This drink was given to you during your hospital  pre-op appointment visit. Nothing else to drink after completing the  Pre-Surgery Clear Ensure or G2.          If you have questions, please contact your surgeon's office.  Oral Hygiene is also important to reduce your risk of infection.                                     Remember - BRUSH YOUR TEETH THE MORNING OF SURGERY WITH YOUR REGULAR TOOTHPASTE  DENTURES WILL BE REMOVED PRIOR TO SURGERY PLEASE DO NOT APPLY "Poly grip" OR ADHESIVES!!!   Do NOT smoke after Midnight   Take these medicines the morning of surgery with A SIP OF WATER: allopurinol.  DO NOT TAKE ANY ORAL DIABETIC  MEDICATIONS DAY OF YOUR SURGERY  Bring CPAP mask and tubing day of surgery.                              You may not have any metal on your body including hair pins, jewelry, and body piercing             Do not wear lotions, powders, perfumes/cologne, or deodorant              Men may shave face and neck.   Do not bring valuables to the hospital. [facility] IS NOT             RESPONSIBLE   FOR VALUABLES.   Contacts, glasses, or bridgework may not be worn into surgery.   Bring small overnight bag day of surgery.   DO NOT BRING YOUR HOME MEDICATIONS TO THE HOSPITAL. PHARMACY WILL DISPENSE MEDICATIONS LISTED ON YOUR MEDICATION LIST TO YOU DURING YOUR ADMISSION IN THE HOSPITAL!    Patients discharged on the day of surgery will not be allowed to drive home.  Someone NEEDS to stay with you for the first 24 hours after anesthesia.   Special Instructions: Bring a copy of your healthcare power of attorney and living will documents         the day of surgery if you haven't scanned them before.              Please read over the following fact sheets you were given: IF YOU HAVE QUESTIONS ABOUT YOUR PRE-OP INSTRUCTIONS PLEASE CALL [phone]      Pre-operative 5 CHG Bath Instructions   You can play a key role in reducing the risk of infection after surgery. Your skin needs to be as free of germs as possible. You can reduce the number of germs on your skin by washing with CHG (chlorhexidine gluconate) soap before surgery. CHG is an antiseptic soap that kills germs and continues to kill germs even after washing.   DO NOT use if you have an allergy to chlorhexidine/CHG or  antibacterial soaps. If your skin becomes reddened or irritated, stop using the CHG and notify one of our RNs at : [phone].   Please shower with the CHG soap starting 4 days before surgery using the following schedule:     Please keep in mind the following:  DO NOT shave, including legs and underarms, starting the day of your first shower.   You may shave your face at any point before/day of surgery.  Place clean sheets on your bed the day you start using CHG soap. Use a clean washcloth (not used since being washed) for each shower. DO NOT sleep with pets once you start using the CHG.   CHG Shower Instructions:  If you choose to wash your hair and private area, wash first with your normal shampoo/soap.  After you use shampoo/soap, rinse your hair and body thoroughly to remove shampoo/soap residue.  Turn the water OFF and apply about 3 tablespoons (45 ml) of CHG soap to a CLEAN washcloth.  Apply CHG soap ONLY FROM YOUR NECK DOWN TO YOUR TOES (washing for 3-5 minutes)  DO NOT use CHG soap on face, private areas, open wounds, or sores.  Pay special attention to the area where your surgery is being performed.  If you are having back surgery, having someone wash your back for you may be helpful. Wait 2 minutes after CHG  soap is applied, then you may rinse off the CHG soap.  Pat dry with a clean towel  Put on clean clothes/pajamas   If you choose to wear lotion, please use ONLY the CHG-compatible lotions on the back of this paper.     Additional instructions for the day of surgery: DO NOT APPLY any lotions, deodorants, cologne, or perfumes.   Put on clean/comfortable clothes.  Brush your teeth.  Ask your nurse before applying any prescription medications to the skin.      CHG Compatible Lotions   Aveeno Moisturizing lotion  Cetaphil Moisturizing Cream  Cetaphil Moisturizing Lotion  Clairol Herbal Essence Moisturizing Lotion, Dry Skin  Clairol Herbal Essence Moisturizing  Lotion, Extra Dry Skin  Clairol Herbal Essence Moisturizing Lotion, Normal Skin  Curel Age Defying Therapeutic Moisturizing Lotion with Alpha Hydroxy  Curel Extreme Care Body Lotion  Curel Soothing Hands Moisturizing Hand Lotion  Curel Therapeutic Moisturizing Cream, Fragrance-Free  Curel Therapeutic Moisturizing Lotion, Fragrance-Free  Curel Therapeutic Moisturizing Lotion, Original Formula  Eucerin Daily Replenishing Lotion  Eucerin Dry Skin Therapy Plus Alpha Hydroxy Crme  Eucerin Dry Skin Therapy Plus Alpha Hydroxy Lotion  Eucerin Original Crme  Eucerin Original Lotion  Eucerin Plus Crme Eucerin Plus Lotion  Eucerin TriLipid Replenishing Lotion  Keri Anti-Bacterial Hand Lotion  Keri Deep Conditioning Original Lotion Dry Skin Formula Softly Scented  Keri Deep Conditioning Original Lotion, Fragrance Free Sensitive Skin Formula  Keri Lotion Fast Absorbing Fragrance Free Sensitive Skin Formula  Keri Lotion Fast Absorbing Softly Scented Dry Skin Formula  Keri Original Lotion  Keri Skin Renewal Lotion Keri Silky Smooth Lotion  Keri Silky Smooth Sensitive Skin Lotion  Nivea Body Creamy Conditioning Oil  Nivea Body Extra Enriched Lotion  Nivea Body Original Lotion  Nivea Body Sheer Moisturizing Lotion Nivea Crme  Nivea Skin Firming Lotion  NutraDerm 30 Skin Lotion  NutraDerm Skin Lotion  NutraDerm Therapeutic Skin Cream  NutraDerm Therapeutic Skin Lotion  ProShield Protective Hand Cream  Provon moisturizing lotion   Incentive Spirometer  An incentive spirometer is a tool that can help keep your lungs clear and active. This tool measures how well you are filling your lungs with each breath. Taking long deep breaths may help reverse or decrease the chance of developing breathing (pulmonary) problems (especially infection) following: A long period of time when you are unable to move or be active. BEFORE THE PROCEDURE  If the spirometer includes an indicator to show your best  effort, your nurse or respiratory therapist will set it to a desired goal. If possible, sit up straight or lean slightly forward. Try not to slouch. Hold the incentive spirometer in an upright position. INSTRUCTIONS FOR USE  Sit on the edge of your bed if possible, or sit up as far as you can in bed or on a chair. Hold the incentive spirometer in an upright position. Breathe out normally. Place the mouthpiece in your mouth and seal your lips tightly around it. Breathe in slowly and as deeply as possible, raising the piston or the ball toward the top of the column. Hold your breath for 3-5 seconds or for as long as possible. Allow the piston or ball to fall to the bottom of the column. Remove the mouthpiece from your mouth and breathe out normally. Rest for a few seconds and repeat Steps 1 through 7 at least 10 times every 1-2 hours when you are awake. Take your time and take a few normal breaths between deep breaths. The spirometer may  include an indicator to show your best effort. Use the indicator as a goal to work toward during each repetition. After each set of 10 deep breaths, practice coughing to be sure your lungs are clear. If you have an incision (the cut made at the time of surgery), support your incision when coughing by placing a pillow or rolled up towels firmly against it. Once you are able to get out of bed, walk around indoors and cough well. You may stop using the incentive spirometer when instructed by your caregiver.  RISKS AND COMPLICATIONS Take your time so you do not get dizzy or light-headed. If you are in pain, you may need to take or ask for pain medication before doing incentive spirometry. It is harder to take a deep breath if you are having pain. AFTER USE Rest and breathe slowly and easily. It can be helpful to keep track of a log of your progress. Your caregiver can provide you with a simple table to help with this. If you are using the spirometer at home, follow  these instructions: SEEK MEDICAL CARE IF:  You are having difficultly using the spirometer. You have trouble using the spirometer as often as instructed. Your pain medication is not giving enough relief while using the spirometer. You develop fever of 100.5 F (38.1 C) or higher. SEEK IMMEDIATE MEDICAL CARE IF:  You cough up bloody sputum that had not been present before. You develop fever of 102 F (38.9 C) or greater. You develop worsening pain at or near the incision site. MAKE SURE YOU:  Understand these instructions. Will watch your condition. Will get help right away if you are not doing well or get worse. Document Released: 06/16/2006 Document Revised: 04/28/2011 Document Reviewed: 08/17/2006 [facility] Patient Information 2014 [place], [place].   ________________________________________________________________________

## 2022-08-22 ENCOUNTER — Other Ambulatory Visit: Payer: Self-pay

## 2022-08-22 ENCOUNTER — Encounter (HOSPITAL_COMMUNITY): Payer: Self-pay

## 2022-08-22 ENCOUNTER — Encounter (HOSPITAL_COMMUNITY)
Admission: RE | Admit: 2022-08-22 | Discharge: 2022-08-22 | Disposition: A | Discharge status: Home / Self Care | Payer: 59 | Source: Ambulatory Visit | Attending: Orthopedic Surgery | Admitting: Orthopedic Surgery

## 2022-08-22 VITALS — BP 128/96 | HR 69 | Temp 98.1°F | Ht 74.0 in | Wt 248.0 lb

## 2022-08-22 DIAGNOSIS — M87051 Idiopathic aseptic necrosis of right femur: Secondary | ICD-10-CM | POA: Diagnosis not present

## 2022-08-22 DIAGNOSIS — M25551 Pain in right hip: Secondary | ICD-10-CM | POA: Insufficient documentation

## 2022-08-22 DIAGNOSIS — I10 Essential (primary) hypertension: Secondary | ICD-10-CM | POA: Diagnosis not present

## 2022-08-22 DIAGNOSIS — Z01812 Encounter for preprocedural laboratory examination: Secondary | ICD-10-CM | POA: Insufficient documentation

## 2022-08-22 DIAGNOSIS — I2510 Atherosclerotic heart disease of native coronary artery without angina pectoris: Secondary | ICD-10-CM | POA: Diagnosis not present

## 2022-08-22 DIAGNOSIS — Z01818 Encounter for other preprocedural examination: Secondary | ICD-10-CM

## 2022-08-22 DIAGNOSIS — G8929 Other chronic pain: Secondary | ICD-10-CM | POA: Insufficient documentation

## 2022-08-22 DIAGNOSIS — M1611 Unilateral primary osteoarthritis, right hip: Secondary | ICD-10-CM | POA: Insufficient documentation

## 2022-08-22 HISTORY — DX: Unspecified osteoarthritis, unspecified site: M19.90

## 2022-08-22 HISTORY — DX: Type 2 diabetes mellitus without complications: E11.9

## 2022-08-22 LAB — CBC WITH DIFFERENTIAL/PLATELET
Abs Immature Granulocytes: 0.01 10*3/uL (ref 0.00–0.07)
Basophils Absolute: 0 10*3/uL (ref 0.0–0.1)
Basophils Relative: 1 %
Eosinophils Absolute: 0.2 10*3/uL (ref 0.0–0.5)
Eosinophils Relative: 5 %
HCT: 42 % (ref 39.0–52.0)
Hemoglobin: 14 g/dL (ref 13.0–17.0)
Immature Granulocytes: 0 %
Lymphocytes Relative: 34 %
Lymphs Abs: 1.2 10*3/uL (ref 0.7–4.0)
MCH: 30.5 pg (ref 26.0–34.0)
MCHC: 33.3 g/dL (ref 30.0–36.0)
MCV: 91.5 fL (ref 80.0–100.0)
Monocytes Absolute: 0.3 10*3/uL (ref 0.1–1.0)
Monocytes Relative: 10 %
Neutro Abs: 1.8 10*3/uL (ref 1.7–7.7)
Neutrophils Relative %: 50 %
Platelets: 174 10*3/uL (ref 150–400)
RBC: 4.59 MIL/uL (ref 4.22–5.81)
RDW: 13.4 % (ref 11.5–15.5)
WBC: 3.6 10*3/uL — ABNORMAL LOW (ref 4.0–10.5)
nRBC: 0 % (ref 0.0–0.2)

## 2022-08-22 LAB — SURGICAL PCR SCREEN
MRSA, PCR: NEGATIVE
Staphylococcus aureus: NEGATIVE

## 2022-08-22 LAB — COMPREHENSIVE METABOLIC PANEL
ALT: 33 U/L (ref 0–44)
AST: 35 U/L (ref 15–41)
Albumin: 4.2 g/dL (ref 3.5–5.0)
Alkaline Phosphatase: 57 U/L (ref 38–126)
Anion gap: 7 (ref 5–15)
BUN: 17 mg/dL (ref 6–20)
CO2: 20 mmol/L — ABNORMAL LOW (ref 22–32)
Calcium: 8.8 mg/dL — ABNORMAL LOW (ref 8.9–10.3)
Chloride: 109 mmol/L (ref 98–111)
Creatinine, Ser: 1.06 mg/dL (ref 0.61–1.24)
GFR, Estimated: >60 mL/min (ref >60)
Glucose, Bld: 129 mg/dL — ABNORMAL HIGH (ref 70–99)
Potassium: 4.2 mmol/L (ref 3.5–5.1)
Sodium: 136 mmol/L (ref 135–145)
Total Bilirubin: 0.8 mg/dL (ref 0.3–1.2)
Total Protein: 7 g/dL (ref 6.5–8.1)

## 2022-08-22 LAB — TYPE AND SCREEN
ABO/RH(D): A POS
Antibody Screen: NEGATIVE

## 2022-08-22 LAB — HEMOGLOBIN A1C
Hgb A1c MFr Bld: 5.8 % — ABNORMAL HIGH (ref 4.8–5.6)
Mean Plasma Glucose: 119.76 mg/dL

## 2022-08-22 LAB — GLUCOSE, CAPILLARY: Glucose-Capillary: 134 mg/dL — ABNORMAL HIGH (ref 70–99)

## 2022-08-22 NOTE — Progress Notes (Signed)
For Short Stay: COVID SWAB appointment date:  Bowel Prep reminder:   For Anesthesia: PCP - Dr. Assunta Found. LOV: 05/29/22: Clearance: 07/17/22: Chart Cardiologist - Dr. Bryan Lemma. LOV: 03/05/22  Chest x-ray -  EKG - 03/05/22 Stress Test -  ECHO - 03/04/18 Cardiac Cath - 09/21/19 Pacemaker/ICD device last checked: Pacemaker orders received: Device Rep notified:  Spinal Cord Stimulator: N/A  Sleep Study - N/A CPAP -   Fasting Blood Sugar - N/A Checks Blood Sugar _____ times a day Date and result of last Hgb A1c- 6.3: 03/06/21  Last dose of GLP1 agonist- N/A GLP1 instructions:   Last dose of SGLT-2 inhibitors- N/A SGLT-2 instructions:   Blood Thinner Instructions: N/A Aspirin Instructions: Last Dose:  Activity level: Can go up a flight of stairs and activities of daily living without stopping and without chest pain and/or shortness of breath   Able to exercise without chest pain and/or shortness of breath  Anesthesia review: Hx: HTN,CAD,DIA.  Patient denies shortness of breath, fever, cough and chest pain at PAT appointment   Patient verbalized understanding of instructions that were given to them at the PAT appointment. Patient was also instructed that they will need to review over the PAT instructions again at home before surgery.

## 2022-08-27 NOTE — Anesthesia Preprocedure Evaluation (Addendum)
Anesthesia Evaluation  Patient identified by MRN, date of birth, ID band Patient awake    Reviewed: Allergy & Precautions, NPO status , Patient's Chart, lab work & pertinent test results  Airway Mallampati: II  TM Distance: >3 FB Neck ROM: Full    Dental  (+) Dental Advisory Given, Partial Upper   Pulmonary neg pulmonary ROS   Pulmonary exam normal breath sounds clear to auscultation       Cardiovascular hypertension, Pt. on medications (-) angina + CAD  (-) Past MI and (-) Cardiac Stents Normal cardiovascular exam Rhythm:Regular Rate:Normal     Neuro/Psych negative neurological ROS  negative psych ROS   GI/Hepatic negative GI ROS,,,Chronic pancreatitis   Endo/Other  diabetes  Obesity   Renal/GU negative Renal ROS     Musculoskeletal  (+) Arthritis , Osteoarthritis,    Abdominal   Peds  Hematology negative hematology ROS (+) Plt 174k   Anesthesia Other Findings Day of surgery medications reviewed with the patient.  Reproductive/Obstetrics                             Anesthesia Physical Anesthesia Plan  ASA: 2  Anesthesia Plan: Spinal   Post-op Pain Management: Tylenol PO (pre-op)* and Toradol IV (intra-op)*   Induction: Intravenous  PONV Risk Score and Plan: 1 and TIVA, Midazolam, Dexamethasone and Ondansetron  Airway Management Planned: Simple Face Mask and Natural Airway  Additional Equipment:   Intra-op Plan:   Post-operative Plan:   Informed Consent: I have reviewed the patients History and Physical, chart, labs and discussed the procedure including the risks, benefits and alternatives for the proposed anesthesia with the patient or authorized representative who has indicated his/her understanding and acceptance.     Dental advisory given  Plan Discussed with: CRNA, Anesthesiologist and Surgeon  Anesthesia Plan Comments: (See PAT note 08/22/2022)        Anesthesia Quick Evaluation
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
## 2022-08-28 ENCOUNTER — Other Ambulatory Visit: Payer: Self-pay

## 2022-08-28 ENCOUNTER — Telehealth: Payer: Self-pay

## 2022-08-28 DIAGNOSIS — E785 Hyperlipidemia, unspecified: Secondary | ICD-10-CM

## 2022-08-28 LAB — LIPID PANEL
Chol/HDL Ratio: 1.4 ratio (ref 0.0–5.0)
Cholesterol, Total: 111 mg/dL (ref 100–199)
HDL: 77 mg/dL (ref >39)
LDL Chol Calc (NIH): 23 mg/dL (ref 0–99)
Triglycerides: 38 mg/dL (ref 0–149)
VLDL Cholesterol Cal: 11 mg/dL (ref 5–40)

## 2022-08-28 NOTE — Telephone Encounter (Signed)
Patient came for Labs for Lipid and CMP.  He had old orders expired.  Patietn just had CMP done with other physician on 7/5 so insurance will not pay to repeat in oless than a week.  Lipid ordered to have drawn while in office.

## 2022-09-03 ENCOUNTER — Encounter (HOSPITAL_COMMUNITY)
Admission: RE | Disposition: A | Discharge status: Home / Self Care | Payer: Self-pay | Source: Home / Self Care | Attending: Orthopedic Surgery

## 2022-09-03 ENCOUNTER — Other Ambulatory Visit: Payer: Self-pay

## 2022-09-03 ENCOUNTER — Ambulatory Visit (HOSPITAL_COMMUNITY): Payer: 59

## 2022-09-03 ENCOUNTER — Ambulatory Visit (HOSPITAL_COMMUNITY)
Admission: RE | Admit: 2022-09-03 | Discharge: 2022-09-03 | Disposition: A | Discharge status: Home / Self Care | Payer: 59 | Attending: Orthopedic Surgery | Admitting: Orthopedic Surgery

## 2022-09-03 ENCOUNTER — Encounter (HOSPITAL_COMMUNITY): Payer: Self-pay | Admitting: Orthopedic Surgery

## 2022-09-03 ENCOUNTER — Ambulatory Visit (HOSPITAL_BASED_OUTPATIENT_CLINIC_OR_DEPARTMENT_OTHER): Payer: 59 | Admitting: Anesthesiology

## 2022-09-03 ENCOUNTER — Ambulatory Visit (HOSPITAL_COMMUNITY): Payer: 59 | Admitting: Physician Assistant

## 2022-09-03 DIAGNOSIS — I2510 Atherosclerotic heart disease of native coronary artery without angina pectoris: Secondary | ICD-10-CM | POA: Diagnosis not present

## 2022-09-03 DIAGNOSIS — I10 Essential (primary) hypertension: Secondary | ICD-10-CM | POA: Insufficient documentation

## 2022-09-03 DIAGNOSIS — K861 Other chronic pancreatitis: Secondary | ICD-10-CM | POA: Diagnosis not present

## 2022-09-03 DIAGNOSIS — G8929 Other chronic pain: Secondary | ICD-10-CM

## 2022-09-03 DIAGNOSIS — M1611 Unilateral primary osteoarthritis, right hip: Secondary | ICD-10-CM

## 2022-09-03 DIAGNOSIS — E669 Obesity, unspecified: Secondary | ICD-10-CM | POA: Diagnosis not present

## 2022-09-03 DIAGNOSIS — Z79899 Other long term (current) drug therapy: Secondary | ICD-10-CM | POA: Diagnosis not present

## 2022-09-03 DIAGNOSIS — Z6830 Body mass index (BMI) 30.0-30.9, adult: Secondary | ICD-10-CM | POA: Insufficient documentation

## 2022-09-03 DIAGNOSIS — M87051 Idiopathic aseptic necrosis of right femur: Secondary | ICD-10-CM

## 2022-09-03 DIAGNOSIS — E119 Type 2 diabetes mellitus without complications: Secondary | ICD-10-CM | POA: Diagnosis not present

## 2022-09-03 DIAGNOSIS — M879 Osteonecrosis, unspecified: Secondary | ICD-10-CM | POA: Diagnosis present

## 2022-09-03 DIAGNOSIS — Z01818 Encounter for other preprocedural examination: Secondary | ICD-10-CM

## 2022-09-03 HISTORY — PX: TOTAL HIP ARTHROPLASTY: SHX124

## 2022-09-03 LAB — ABO/RH: ABO/RH(D): A POS

## 2022-09-03 SURGERY — ARTHROPLASTY, HIP, TOTAL,POSTERIOR APPROACH
Anesthesia: Spinal | Site: Hip | Laterality: Right

## 2022-09-03 MED ORDER — DIPHENHYDRAMINE HCL 12.5 MG/5ML PO ELIX: 12.5000 mg | ORAL_SOLUTION | ORAL | Status: DC | PRN

## 2022-09-03 MED ORDER — PHENYLEPHRINE 80 MCG/ML (10ML) SYRINGE FOR IV PUSH (FOR BLOOD PRESSURE SUPPORT)
PREFILLED_SYRINGE | INTRAVENOUS | Status: AC
  Filled 2022-09-03: qty 10

## 2022-09-03 MED ORDER — PROPOFOL 1000 MG/100ML IV EMUL
INTRAVENOUS | Status: AC
  Filled 2022-09-03: qty 100

## 2022-09-03 MED ORDER — ONDANSETRON HCL 4 MG PO TABS: 4.0000 mg | ORAL_TABLET | Freq: Four times a day (QID) | ORAL | Status: DC | PRN

## 2022-09-03 MED ORDER — MENTHOL 3 MG MT LOZG: 1.0000 | LOZENGE | OROMUCOSAL | Status: DC | PRN

## 2022-09-03 MED ORDER — EPHEDRINE SULFATE-NACL 50-0.9 MG/10ML-% IV SOSY
PREFILLED_SYRINGE | INTRAVENOUS | Status: DC | PRN
  Administered 2022-09-03 (×2): 5 mg via INTRAVENOUS

## 2022-09-03 MED ORDER — PHENYLEPHRINE HCL (PRESSORS) 10 MG/ML IV SOLN
INTRAVENOUS | Status: AC
  Filled 2022-09-03: qty 1

## 2022-09-03 MED ORDER — ISOPROPYL ALCOHOL 70 % SOLN
Status: DC | PRN
  Administered 2022-09-03: 1 via TOPICAL

## 2022-09-03 MED ORDER — KETOROLAC TROMETHAMINE 15 MG/ML IJ SOLN
INTRAMUSCULAR | Status: AC
  Filled 2022-09-03: qty 1

## 2022-09-03 MED ORDER — PHENYLEPHRINE 80 MCG/ML (10ML) SYRINGE FOR IV PUSH (FOR BLOOD PRESSURE SUPPORT)
PREFILLED_SYRINGE | INTRAVENOUS | Status: DC | PRN
  Administered 2022-09-03 (×5): 80 ug via INTRAVENOUS

## 2022-09-03 MED ORDER — ASPIRIN 81 MG PO CHEW: 81.0000 mg | CHEWABLE_TABLET | Freq: Two times a day (BID) | ORAL | Status: DC

## 2022-09-03 MED ORDER — DEXAMETHASONE SODIUM PHOSPHATE 10 MG/ML IJ SOLN
8.0000 mg | Freq: Once | INTRAMUSCULAR | Status: AC
  Administered 2022-09-03: 10 mg via INTRAVENOUS

## 2022-09-03 MED ORDER — MIDAZOLAM HCL 2 MG/2ML IJ SOLN
INTRAMUSCULAR | Status: AC
  Filled 2022-09-03: qty 2

## 2022-09-03 MED ORDER — LACTATED RINGERS IV SOLN: INTRAVENOUS | Status: DC

## 2022-09-03 MED ORDER — LACTATED RINGERS IV BOLUS
250.0000 mL | Freq: Once | INTRAVENOUS | Status: AC
  Administered 2022-09-03: 250 mL via INTRAVENOUS

## 2022-09-03 MED ORDER — FENTANYL CITRATE (PF) 100 MCG/2ML IJ SOLN
INTRAMUSCULAR | Status: AC
  Filled 2022-09-03: qty 2

## 2022-09-03 MED ORDER — TRANEXAMIC ACID-NACL 1000-0.7 MG/100ML-% IV SOLN
1000.0000 mg | INTRAVENOUS | Status: AC
  Administered 2022-09-03: 1000 mg via INTRAVENOUS
  Filled 2022-09-03: qty 100

## 2022-09-03 MED ORDER — BUPIVACAINE LIPOSOME 1.3 % IJ SUSP: 10.0000 mL | Freq: Once | INTRAMUSCULAR | Status: DC

## 2022-09-03 MED ORDER — ACETAMINOPHEN 500 MG PO TABS
ORAL_TABLET | ORAL | Status: AC
  Administered 2022-09-03: 1000 mg via ORAL
  Filled 2022-09-03: qty 2

## 2022-09-03 MED ORDER — POVIDONE-IODINE 10 % EX SWAB: 2.0000 | Freq: Once | CUTANEOUS | Status: DC

## 2022-09-03 MED ORDER — CEFAZOLIN SODIUM-DEXTROSE 2-4 GM/100ML-% IV SOLN
2.0000 g | INTRAVENOUS | Status: AC
  Administered 2022-09-03: 2 g via INTRAVENOUS
  Filled 2022-09-03: qty 100

## 2022-09-03 MED ORDER — OXYCODONE HCL 5 MG PO TABS: 5.0000 mg | ORAL_TABLET | ORAL | Status: DC | PRN

## 2022-09-03 MED ORDER — METHOCARBAMOL 500 MG IVPB - SIMPLE MED: 500.0000 mg | Freq: Four times a day (QID) | INTRAVENOUS | Status: DC | PRN

## 2022-09-03 MED ORDER — ISOPROPYL ALCOHOL 70 % SOLN
Status: AC
  Filled 2022-09-03: qty 480

## 2022-09-03 MED ORDER — INSULIN ASPART 100 UNIT/ML IJ SOLN: 0.0000 [IU] | INTRAMUSCULAR | Status: DC | PRN

## 2022-09-03 MED ORDER — SODIUM CHLORIDE (PF) 0.9 % IJ SOLN
INTRAMUSCULAR | Status: AC
  Filled 2022-09-03: qty 50

## 2022-09-03 MED ORDER — FENTANYL CITRATE PF 50 MCG/ML IJ SOSY: 25.0000 ug | PREFILLED_SYRINGE | INTRAMUSCULAR | Status: DC | PRN

## 2022-09-03 MED ORDER — BUPIVACAINE LIPOSOME 1.3 % IJ SUSP
INTRAMUSCULAR | Status: AC
  Filled 2022-09-03: qty 20

## 2022-09-03 MED ORDER — DEXAMETHASONE SODIUM PHOSPHATE 10 MG/ML IJ SOLN
INTRAMUSCULAR | Status: AC
  Filled 2022-09-03: qty 1

## 2022-09-03 MED ORDER — PHENYLEPHRINE HCL-NACL 20-0.9 MG/250ML-% IV SOLN
INTRAVENOUS | Status: DC | PRN
  Administered 2022-09-03: 40 ug/min via INTRAVENOUS

## 2022-09-03 MED ORDER — LACTATED RINGERS IV BOLUS
500.0000 mL | Freq: Once | INTRAVENOUS | Status: AC
  Administered 2022-09-03: 500 mL via INTRAVENOUS

## 2022-09-03 MED ORDER — ORAL CARE MOUTH RINSE: 15.0000 mL | Freq: Once | OROMUCOSAL | Status: AC

## 2022-09-03 MED ORDER — HYDROMORPHONE HCL 1 MG/ML IJ SOLN: 0.5000 mg | INTRAMUSCULAR | Status: DC | PRN

## 2022-09-03 MED ORDER — ONDANSETRON HCL 4 MG/2ML IJ SOLN
INTRAMUSCULAR | Status: AC
  Filled 2022-09-03: qty 2

## 2022-09-03 MED ORDER — ACETAMINOPHEN 325 MG PO TABS: 325.0000 mg | ORAL_TABLET | Freq: Four times a day (QID) | ORAL | Status: DC | PRN

## 2022-09-03 MED ORDER — AMISULPRIDE (ANTIEMETIC) 5 MG/2ML IV SOLN: 10.0000 mg | Freq: Once | INTRAVENOUS | Status: DC | PRN

## 2022-09-03 MED ORDER — CHLORHEXIDINE GLUCONATE 0.12 % MT SOLN
15.0000 mL | Freq: Once | OROMUCOSAL | Status: AC
  Administered 2022-09-03: 15 mL via OROMUCOSAL

## 2022-09-03 MED ORDER — ONDANSETRON HCL 4 MG PO TABS: 4.0000 mg | ORAL_TABLET | Freq: Three times a day (TID) | ORAL | 0 refills | Status: AC | PRN

## 2022-09-03 MED ORDER — DOCUSATE SODIUM 100 MG PO CAPS: 100.0000 mg | ORAL_CAPSULE | Freq: Two times a day (BID) | ORAL | Status: DC

## 2022-09-03 MED ORDER — PANTOPRAZOLE SODIUM 40 MG PO TBEC: 40.0000 mg | DELAYED_RELEASE_TABLET | Freq: Every day | ORAL | Status: DC

## 2022-09-03 MED ORDER — SODIUM CHLORIDE (PF) 0.9 % IJ SOLN
INTRAMUSCULAR | Status: DC | PRN
  Administered 2022-09-03: 30 mL

## 2022-09-03 MED ORDER — ONDANSETRON HCL 4 MG/2ML IJ SOLN: 4.0000 mg | Freq: Once | INTRAMUSCULAR | Status: DC | PRN

## 2022-09-03 MED ORDER — ONDANSETRON HCL 4 MG/2ML IJ SOLN
INTRAMUSCULAR | Status: DC | PRN
  Administered 2022-09-03: 4 mg via INTRAVENOUS

## 2022-09-03 MED ORDER — SODIUM CHLORIDE 0.9 % IR SOLN
Status: DC | PRN
  Administered 2022-09-03: 3000 mL

## 2022-09-03 MED ORDER — KETOROLAC TROMETHAMINE 30 MG/ML IJ SOLN
INTRAMUSCULAR | Status: DC | PRN
  Administered 2022-09-03: 30 mg via INTRAVENOUS

## 2022-09-03 MED ORDER — 0.9 % SODIUM CHLORIDE (POUR BTL) OPTIME
TOPICAL | Status: DC | PRN
  Administered 2022-09-03: 1000 mL

## 2022-09-03 MED ORDER — OXYCODONE HCL 5 MG PO TABS: 5.0000 mg | ORAL_TABLET | ORAL | 0 refills | Status: AC | PRN

## 2022-09-03 MED ORDER — ACETAMINOPHEN 500 MG PO TABS: 1000.0000 mg | ORAL_TABLET | Freq: Four times a day (QID) | ORAL | Status: DC

## 2022-09-03 MED ORDER — SENNA 8.6 MG PO TABS: 1.0000 | ORAL_TABLET | Freq: Two times a day (BID) | ORAL | Status: DC

## 2022-09-03 MED ORDER — BUPIVACAINE IN DEXTROSE 0.75-8.25 % IT SOLN
INTRATHECAL | Status: DC | PRN
  Administered 2022-09-03: 2 mL via INTRATHECAL

## 2022-09-03 MED ORDER — PHENOL 1.4 % MT LIQD: 1.0000 | OROMUCOSAL | Status: DC | PRN

## 2022-09-03 MED ORDER — ACETAMINOPHEN 500 MG PO TABS: 1000.0000 mg | ORAL_TABLET | Freq: Three times a day (TID) | ORAL | Status: AC | PRN

## 2022-09-03 MED ORDER — CEFAZOLIN SODIUM-DEXTROSE 2-4 GM/100ML-% IV SOLN: 2.0000 g | Freq: Four times a day (QID) | INTRAVENOUS | Status: DC

## 2022-09-03 MED ORDER — MIDAZOLAM HCL 2 MG/2ML IJ SOLN
INTRAMUSCULAR | Status: DC | PRN
  Administered 2022-09-03: 2 mg via INTRAVENOUS

## 2022-09-03 MED ORDER — KETOROLAC TROMETHAMINE 15 MG/ML IJ SOLN
15.0000 mg | Freq: Four times a day (QID) | INTRAMUSCULAR | Status: DC
  Administered 2022-09-03: 15 mg via INTRAVENOUS

## 2022-09-03 MED ORDER — METHOCARBAMOL 500 MG PO TABS: 500.0000 mg | ORAL_TABLET | Freq: Three times a day (TID) | ORAL | 0 refills | Status: AC | PRN

## 2022-09-03 MED ORDER — MELOXICAM 15 MG PO TABS: 15.0000 mg | ORAL_TABLET | Freq: Every day | ORAL | 0 refills | Status: AC

## 2022-09-03 MED ORDER — METHOCARBAMOL 500 MG PO TABS: 500.0000 mg | ORAL_TABLET | Freq: Four times a day (QID) | ORAL | Status: DC | PRN

## 2022-09-03 MED ORDER — BUPIVACAINE LIPOSOME 1.3 % IJ SUSP
INTRAMUSCULAR | Status: DC | PRN
  Administered 2022-09-03: 20 mL

## 2022-09-03 MED ORDER — BUPIVACAINE-EPINEPHRINE 0.25% -1:200000 IJ SOLN
INTRAMUSCULAR | Status: AC
  Filled 2022-09-03: qty 1

## 2022-09-03 MED ORDER — ONDANSETRON HCL 4 MG/2ML IJ SOLN: 4.0000 mg | Freq: Four times a day (QID) | INTRAMUSCULAR | Status: DC | PRN

## 2022-09-03 MED ORDER — EPHEDRINE 5 MG/ML INJ
INTRAVENOUS | Status: AC
  Filled 2022-09-03: qty 5

## 2022-09-03 MED ORDER — OMEPRAZOLE 40 MG PO CPDR: 40.0000 mg | DELAYED_RELEASE_CAPSULE | Freq: Every day | ORAL | 0 refills | Status: DC

## 2022-09-03 MED ORDER — SODIUM CHLORIDE 0.9 % IV SOLN: INTRAVENOUS | Status: DC

## 2022-09-03 MED ORDER — POLYETHYLENE GLYCOL 3350 17 G PO PACK: 17.0000 g | PACK | Freq: Every day | ORAL | Status: DC | PRN

## 2022-09-03 MED ORDER — BUPIVACAINE-EPINEPHRINE 0.25% -1:200000 IJ SOLN
INTRAMUSCULAR | Status: DC | PRN
  Administered 2022-09-03: 30 mL

## 2022-09-03 MED ORDER — WATER FOR IRRIGATION, STERILE IR SOLN
Status: DC | PRN
  Administered 2022-09-03: 2000 mL

## 2022-09-03 MED ORDER — PROPOFOL 10 MG/ML IV BOLUS
INTRAVENOUS | Status: DC | PRN
  Administered 2022-09-03 (×2): 20 mg via INTRAVENOUS

## 2022-09-03 MED ORDER — PROPOFOL 500 MG/50ML IV EMUL
INTRAVENOUS | Status: DC | PRN
  Administered 2022-09-03: 47 ug/kg/min via INTRAVENOUS

## 2022-09-03 MED ORDER — POLYETHYLENE GLYCOL 3350 17 G PO PACK: 17.0000 g | PACK | Freq: Every day | ORAL | 0 refills | Status: DC

## 2022-09-03 MED ORDER — KETOROLAC TROMETHAMINE 30 MG/ML IJ SOLN
INTRAMUSCULAR | Status: AC
  Filled 2022-09-03: qty 1

## 2022-09-03 MED ORDER — ASPIRIN 81 MG PO TBEC: 81.0000 mg | DELAYED_RELEASE_TABLET | Freq: Two times a day (BID) | ORAL | Status: AC

## 2022-09-03 MED ORDER — ACETAMINOPHEN 325 MG PO TABS
650.0000 mg | ORAL_TABLET | Freq: Once | ORAL | Status: AC
  Administered 2022-09-03: 650 mg via ORAL
  Filled 2022-09-03: qty 2

## 2022-09-03 SURGICAL SUPPLY — 73 items
ADH SKN CLS APL DERMABOND .7 (GAUZE/BANDAGES/DRESSINGS) ×1
APL PRP STRL LF DISP 70% ISPRP (MISCELLANEOUS) ×2
BAG COUNTER SPONGE SURGICOUNT (BAG) IMPLANT
BAG SPEC THK2 15X12 ZIP CLS (MISCELLANEOUS) ×1
BAG SPNG CNTER NS LX DISP (BAG) ×1
BAG ZIPLOCK 12X15 (MISCELLANEOUS) ×1 IMPLANT
BLADE SAW SAG 25X90X1.19 (BLADE) ×1 IMPLANT
CHLORAPREP W/TINT 26 (MISCELLANEOUS) ×2 IMPLANT
CNTNR URN SCR LID CUP LEK RST (MISCELLANEOUS) ×1 IMPLANT
CONT SPEC 4OZ STRL OR WHT (MISCELLANEOUS) ×1
COVER SURGICAL LIGHT HANDLE (MISCELLANEOUS) ×1 IMPLANT
DERMABOND ADVANCED .7 DNX12 (GAUZE/BANDAGES/DRESSINGS) ×1 IMPLANT
DRAPE HIP W/POCKET STRL (MISCELLANEOUS) ×1 IMPLANT
DRAPE INCISE IOBAN 66X45 STRL (DRAPES) ×1 IMPLANT
DRAPE INCISE IOBAN 85X60 (DRAPES) ×1 IMPLANT
DRAPE POUCH INSTRU U-SHP 10X18 (DRAPES) ×1 IMPLANT
DRAPE SHEET LG 3/4 BI-LAMINATE (DRAPES) ×3 IMPLANT
DRAPE U-SHAPE 47X51 STRL (DRAPES) ×2 IMPLANT
DRESSING AQUACEL AG SP 3.5X10 (GAUZE/BANDAGES/DRESSINGS) ×1 IMPLANT
DRSG AQUACEL AG ADV 3.5X10 (GAUZE/BANDAGES/DRESSINGS) IMPLANT
DRSG AQUACEL AG SP 3.5X10 (GAUZE/BANDAGES/DRESSINGS) ×1
ELECT BLADE TIP CTD 4 INCH (ELECTRODE) ×1 IMPLANT
ELECT REM PT RETURN 15FT ADLT (MISCELLANEOUS) ×1 IMPLANT
GAUZE SPONGE 4X4 12PLY STRL (GAUZE/BANDAGES/DRESSINGS) ×1 IMPLANT
GLOVE BIO SURGEON STRL SZ 6.5 (GLOVE) ×2 IMPLANT
GLOVE BIOGEL PI IND STRL 6.5 (GLOVE) ×1 IMPLANT
GLOVE BIOGEL PI IND STRL 8 (GLOVE) ×1 IMPLANT
GLOVE SURG ORTHO 8.0 STRL STRW (GLOVE) ×2 IMPLANT
GOWN STRL REUS W/ TWL XL LVL3 (GOWN DISPOSABLE) ×2 IMPLANT
GOWN STRL REUS W/TWL XL LVL3 (GOWN DISPOSABLE) ×2
HANDPIECE INTERPULSE COAX TIP (DISPOSABLE)
HEAD CERAMIC FEMORAL 36MM (Head) IMPLANT
HOLDER FOLEY CATH W/STRAP (MISCELLANEOUS) ×1 IMPLANT
HOOD PEEL AWAY T7 (MISCELLANEOUS) ×3 IMPLANT
INSERT 0 DEG POLY 36 F (Miscellaneous) IMPLANT
KIT BASIN OR (CUSTOM PROCEDURE TRAY) ×1 IMPLANT
KIT TURNOVER KIT A (KITS) IMPLANT
MANIFOLD NEPTUNE II (INSTRUMENTS) ×1 IMPLANT
MARKER SKIN DUAL TIP RULER LAB (MISCELLANEOUS) ×1 IMPLANT
NDL SAFETY ECLIP 18X1.5 (MISCELLANEOUS) ×2 IMPLANT
NS IRRIG 1000ML POUR BTL (IV SOLUTION) ×1 IMPLANT
PACK TOTAL JOINT (CUSTOM PROCEDURE TRAY) ×1 IMPLANT
PAD ARMBOARD 7.5X6 YLW CONV (MISCELLANEOUS) ×1 IMPLANT
PRESSURIZER FEMORAL UNIV (MISCELLANEOUS) IMPLANT
PROTECTOR NERVE ULNAR (MISCELLANEOUS) IMPLANT
RETRIEVER SUT HEWSON (MISCELLANEOUS) ×1 IMPLANT
SCREW HEX LP 6.5X25 (Screw) IMPLANT
SCREW HEX LP 6.5X35 (Screw) IMPLANT
SEALER BIPOLAR AQUA 6.0 (INSTRUMENTS) IMPLANT
SET HNDPC FAN SPRY TIP SCT (DISPOSABLE) IMPLANT
SHELL ACETAB TRIDENT 58 (Shell) IMPLANT
SHIELD FACE FULL FLUID (MISCELLANEOUS) ×1 IMPLANT
SOLUTION IRRIG SURGIPHOR (IV SOLUTION) IMPLANT
SPIKE FLUID TRANSFER (MISCELLANEOUS) ×1 IMPLANT
STEM HIP ANGLE 40X123-127 SZ10 (Stem) IMPLANT
SUCTION TUBE FRAZIER 12FR DISP (SUCTIONS) ×1 IMPLANT
SUT BONE WAX W31G (SUTURE) ×1 IMPLANT
SUT ETHIBOND #5 BRAIDED 30INL (SUTURE) ×1 IMPLANT
SUT MNCRL AB 3-0 PS2 18 (SUTURE) ×1 IMPLANT
SUT STRATAFIX 0 PDS 27 VIOLET (SUTURE) ×1
SUT STRATAFIX PDO 1 14 VIOLET (SUTURE) ×1
SUT STRATFX PDO 1 14 VIOLET (SUTURE) ×1
SUT VIC AB 2-0 CT2 27 (SUTURE) ×2 IMPLANT
SUTURE STRATFX 0 PDS 27 VIOLET (SUTURE) ×1 IMPLANT
SUTURE STRATFX PDO 1 14 VIOLET (SUTURE) ×1 IMPLANT
SYR 30ML LL (SYRINGE) ×1 IMPLANT
SYR 50ML LL SCALE MARK (SYRINGE) ×1 IMPLANT
TOWEL OR 17X26 10 PK STRL BLUE (TOWEL DISPOSABLE) ×1 IMPLANT
TOWER CARTRIDGE SMART MIX (DISPOSABLE) IMPLANT
TRAY FOLEY MTR SLVR 16FR STAT (SET/KITS/TRAYS/PACK) IMPLANT
TUBE SUCTION HIGH CAP CLEAR NV (SUCTIONS) ×1 IMPLANT
UNDERPAD 30X36 HEAVY ABSORB (UNDERPADS AND DIAPERS) ×1 IMPLANT
WATER STERILE IRR 1000ML POUR (IV SOLUTION) ×2 IMPLANT
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
## 2022-09-03 NOTE — Discharge Instructions (Signed)

## 2022-09-03 NOTE — Anesthesia Procedure Notes (Signed)
Spinal  Patient location during procedure: OR Start time: 09/03/2022 8:26 AM End time: 09/03/2022 8:29 AM Reason for block: surgical anesthesia Staffing Performed: anesthesiologist  Anesthesiologist: Collene Schlichter, MD Performed by: Collene Schlichter, MD Authorized by: Collene Schlichter, MD   Preanesthetic Checklist Completed: patient identified, IV checked, risks and benefits discussed, surgical consent, monitors and equipment checked, pre-op evaluation and timeout performed Spinal Block Patient position: sitting Prep: DuraPrep and site prepped and draped Patient monitoring: continuous pulse ox and blood pressure Approach: midline Location: L3-4 Injection technique: single-shot Needle Needle type: Pencan  Needle gauge: 24 G Assessment Events: CSF return Additional Notes Functioning IV was confirmed and monitors were applied. Sterile prep and drape, including hand hygiene, mask and sterile gloves were used. The patient was positioned and the spine was prepped. The skin was anesthetized with lidocaine.  Free flow of clear CSF was obtained prior to injecting local anesthetic into the CSF.  The spinal needle aspirated freely following injection.  The needle was carefully withdrawn.  The patient tolerated the procedure well. Consent was obtained prior to procedure with all questions answered and concerns addressed. Risks including but not limited to bleeding, infection, nerve damage, paralysis, failed block, inadequate analgesia, allergic reaction, high spinal, itching and headache were discussed and the patient wished to proceed.   Hunter Aran, MD

## 2022-09-03 NOTE — Anesthesia Procedure Notes (Signed)
Spinal  Staffing Performed by: Rise Patience, CRNA Authorized by: Collene Schlichter, MD

## 2022-09-03 NOTE — Transfer of Care (Signed)
Immediate Anesthesia Transfer of Care Note  Patient: Hunter Ray  Procedure(s) Performed: TOTAL HIP ARTHROPLASTY (Right: Hip)  Patient Location: PACU  Anesthesia Type:GA combined with regional for post-op pain  Level of Consciousness: awake, oriented, drowsy, and patient cooperative  Airway & Oxygen Therapy: Patient Spontanous Breathing and Patient connected to face mask oxygen  Post-op Assessment: Report given to RN and Post -op Vital signs reviewed and stable  Post vital signs: Reviewed and stable  Last Vitals:  Vitals Value Taken Time  BP 108/72 09/03/22 1056  Temp    Pulse 83 09/03/22 1100  Resp 16 09/03/22 1100  SpO2 100 % 09/03/22 1100  Vitals shown include unfiled device data.  Last Pain:  Vitals:   09/03/22 0740  TempSrc: Oral         Complications: No notable events documented.
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
## 2022-09-03 NOTE — Interval H&P Note (Signed)
The patient has been re-examined, and the chart reviewed, and there have been no interval changes to the documented history and physical.    Plan for R THA for R hip AVN  The operative side was examined and the patient was confirmed to have sensation to DPN, SPN, TN intact, Motor EHL, ext, flex 5/5, and DP 2+, PT 2+, No significant edema.   The risks, benefits, and alternatives have been discussed at length with patient, and the patient is willing to proceed.  Right hip marked. Consent has been signed.  

## 2022-09-04 ENCOUNTER — Encounter (HOSPITAL_COMMUNITY): Payer: Self-pay | Admitting: Orthopedic Surgery

## 2022-09-04 NOTE — Anesthesia Postprocedure Evaluation (Signed)
Anesthesia Post Note  Patient: Hunter Ray  Procedure(s) Performed: TOTAL HIP ARTHROPLASTY (Right: Hip)     Patient location during evaluation: PACU Anesthesia Type: Spinal Level of consciousness: awake, awake and alert and oriented Pain management: pain level controlled Vital Signs Assessment: post-procedure vital signs reviewed and stable Respiratory status: spontaneous breathing, nonlabored ventilation and respiratory function stable Cardiovascular status: blood pressure returned to baseline and stable Postop Assessment: no headache, no backache, spinal receding and no apparent nausea or vomiting Anesthetic complications: no   No notable events documented.  Last Vitals:  Vitals:   09/03/22 1300 09/03/22 1400  BP: (!) 128/93 (!) 133/94  Pulse: (!) 58 (!) 56  Resp:    Temp:    SpO2: 99% 100%    Last Pain:  Vitals:   09/03/22 0740  TempSrc: Oral                 Collene Schlichter

## 2023-03-10 ENCOUNTER — Ambulatory Visit: Payer: 59 | Attending: Cardiology | Admitting: Cardiology

## 2023-03-10 ENCOUNTER — Encounter: Payer: Self-pay | Admitting: Cardiology

## 2023-03-10 VITALS — BP 122/88 | HR 72 | Ht 74.0 in | Wt 252.0 lb

## 2023-03-10 DIAGNOSIS — E66811 Obesity, class 1: Secondary | ICD-10-CM | POA: Diagnosis not present

## 2023-03-10 DIAGNOSIS — R7303 Prediabetes: Secondary | ICD-10-CM

## 2023-03-10 DIAGNOSIS — I2510 Atherosclerotic heart disease of native coronary artery without angina pectoris: Secondary | ICD-10-CM | POA: Diagnosis not present

## 2023-03-10 DIAGNOSIS — E785 Hyperlipidemia, unspecified: Secondary | ICD-10-CM | POA: Diagnosis not present

## 2023-03-10 DIAGNOSIS — M1610 Unilateral primary osteoarthritis, unspecified hip: Secondary | ICD-10-CM

## 2023-03-10 DIAGNOSIS — M109 Gout, unspecified: Secondary | ICD-10-CM

## 2023-03-10 DIAGNOSIS — I10 Essential (primary) hypertension: Secondary | ICD-10-CM

## 2023-03-10 NOTE — Patient Instructions (Signed)
Medication Instructions:  No changes   *If you need a refill on your cardiac medications before your next appointment, please call your pharmacy*   Lab Work:  Not needed   Testing/Procedures:  Not needed  Follow-Up: At [facility], you and your health needs are our priority.  As part of our continuing mission to provide you with exceptional heart care, we have created designated Provider Care Teams.  These Care Teams include your primary Cardiologist (physician) and Advanced Practice Providers (APPs -  Physician Assistants and Nurse Practitioners) who all work together to provide you with the care you need, when you need it.     Your next appointment:   12 month(s)  The format for your next appointment:   In Person  Provider:   Bryan Lemma, MD    Other Instructions  If you decide to have hip surgery - from a cardiac standpoint you are cleared- please have the surgeon office  to contact cardiology office.

## 2023-03-10 NOTE — Progress Notes (Unsigned)
Cardiology Office Note:  .   Date:  03/11/2023  ID:  TRAXTON CATCHING, DOB 1960/06/08, MRN [id number] PCP: Assunta Found, MD  [facility] Providers Cardiologist:  Bryan Lemma, MD { Orthopedic Sgx: Joen Laura, MD  Chief Complaint  Patient presents with   Follow-up    Annual follow-up.  Doing well.   Patient Profile: .     JAMIE BALLIETT is a obese 63 y.o. male  with a PMH notable for HTN, HLD and nonocclusive CAD who presents here for annual follow-up at the request of Assunta Found, MD. He oftentimes presents here today along with his wife-Michelle.    GERSHOM VENIER was last seen on March 05, 2022 for annual follow-up doing well.  No active cardiac symptoms.  Maybe little more sedentary than he would like to be.  Trying to stay active.  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  He walks his dogs which is the extent of his exercise.  As a result of weight was noted some deconditioning related to to sedentary lifestyle and weight gain. Started statin (modest-dose rosuvastatin 20 mg) as LDL is not at goal-Labs ordered.  Discussed weight loss.  Considered HCTZ in addition to ARB.  Avoiding beta-blockers due to fatigue.  Subjective  Discussed the use of AI scribe software for clinical note transcription with the patient, who gave verbal consent to proceed.  History of Present Illness   The patient, Elmer, presents for a routine follow-up visit one year after a previous consultation. He reports no significant health issues over the past year. He has a history of hypertension, hyperlipidemia, and gout, which are managed with Benicar 40mg , Crestor 20mg , and Allopurinol 300mg  respectively. He reports no breakthrough gout episodes and no adverse effects from the Crestor.  Matthe underwent a hip replacement in July of the previous year, which has healed well. However, he now reports discomfort in the other hip, which was anticipated  as both hips were identified as needing replacement. Despite this, he remains active, walking frequently for work and while caring for his dogs.  He denies any symptoms of cardiovascular disease, including chest pain, shortness of breath, palpitations, syncope, or claudication. His weight has remained relatively stable, with a slight increase noted since the last visit.  The patient continues to work and reports a high level of physical activity in his job. He has never smoked, reducing one significant cardiovascular risk factor. His LDL has responded impressively to Crestor, reaching a level of 23.  In summary, the patient is managing his chronic conditions well, with good control of his blood pressure and cholesterol. He remains active despite some ongoing hip discomfort. His weight and borderline HbA1c are areas for potential improvement.      ROS:  Review of Systems - Negative except somewhat dilated by left hip pain at the right hip is improving.    Objective   Current Meds  Medication Sig   allopurinol (ZYLOPRIM) 300 MG tablet Take 300 mg by mouth daily.   olmesartan (BENICAR) 40 MG tablet Take 40 mg by mouth daily.     rosuvastatin (CRESTOR) 20 MG tablet Take 1 tablet (20 mg total) by mouth daily.    Studies Reviewed: Marland Kitchen   EKG Interpretation Date/Time:  Tuesday March 10 2023 07:59:27 EST Ventricular Rate:  67 PR Interval:  196 QRS Duration:  92 QT Interval:  378 QTC Calculation: 399 R Axis:   -19  Text Interpretation: Normal  sinus rhythm Normal ECG When compared with ECG of 30-Oct-2017 09:46, No significant change since last tracing Confirmed by Bryan Lemma ([id number]) on 03/10/2023 8:01:33 AM   No new studies Previous Studies Echo 03/04/2018: Mild focal septal LVH with normal EF of 55 to 60%.  No RWMA.  GR 2 DD.  Normal valves.  No PFO noted. Cardiac CTA 04/08/2018: CAC score 130.  Nonobstructive mild CAD.  Probable small PFO. CATH 09/2019: Mid LAD 10%, distal LAD 10%,  proximal LCx 15%.  Normal LVEF and EDP. Dominance: Right  CPx 11/03/2019: Exercise testing with gas exchange demonstrates mildly reduced functional capacity when compared to matched sedentary norms. There is no clear cardiopulmonary limitation. VE/VCO2 mildly elevated without supplemental data to indicate cardiovascular limitations. Patient appears primarily limited due to deconditioning.   Lab Results  Component Value Date   CHOL 111 08/28/2022   HDL 77 08/28/2022   LDLCALC 23 08/28/2022   TRIG 38 08/28/2022   CHOLHDL 1.4 08/28/2022   Lab Results  Component Value Date   NA 136 08/22/2022   K 4.2 08/22/2022   CREATININE 1.06 08/22/2022   GFRNONAA >60 08/22/2022   GLUCOSE 129 (H) 08/22/2022   Lab Results  Component Value Date   HGBA1C 5.8 (H) 08/22/2022      Latest Ref Rng & Units 08/22/2022    8:15 AM 09/19/2019    2:32 PM 04/08/2018   10:38 AM  CBC  WBC 4.0 - 10.5 K/uL 3.6  9.8  3.6   Hemoglobin 13.0 - 17.0 g/dL 60.4  54.0  98.1   Hematocrit 39.0 - 52.0 % 42.0  36.4  43.2   Platelets 150 - 400 K/uL 174  226  241     Risk Assessment/Calculations:             Physical Exam:   VS:  BP 122/88 (Cuff Size: Large)   Pulse 72   Ht 6\' 2"  (1.88 m)   Wt 252 lb (114.3 kg)   SpO2 93%   BMI 32.35 kg/m    Wt Readings from Last 3 Encounters:  03/10/23 252 lb (114.3 kg)  09/03/22 248 lb (112.5 kg)  08/22/22 248 lb (112.5 kg)    GEN: Well nourished, well developed in no acute distress; mildly obese.  Otherwise healthy-appearing. NECK: No JVD; No carotid bruits CARDIAC: Normal S1, S2; RRR, no murmurs, rubs, gallops RESPIRATORY:  Clear to auscultation without rales, wheezing or rhonchi ; nonlabored, good air movement. ABDOMEN: Soft, non-tender, non-distended EXTREMITIES:  No edema; No deformity     ASSESSMENT AND PLAN: .    Problem List Items Addressed This Visit       Cardiology Problems   Coronary artery disease, non-occlusive (Chronic)   Trivial CAD noted in In 2021.   Has not had any further send him as of chest pain pressure or dyspnea with rest or exertion.  With evidence of CAD, we will continue to treat cardiac risk factors as noted -> ARB and statin, but with minimal disease no aspirin or other antiplatelet  Did well with right hip surgery-has pending left hip surgery with Dr. Blanchie Dessert.  Unless symptoms change over the next year, no reason for further cardiac evaluation prior to surgery.      Relevant Orders   EKG 12-Lead (Completed)   Essential hypertension - Primary (Chronic)   Well controlled on Benicar 40mg  daily. No symptoms of end-organ damage. -Continue Benicar 40mg  daily.      Relevant Orders   EKG 12-Lead (Completed)  Hyperlipidemia with target LDL less than 100 (Chronic)   Excellent response to Crestor 20mg  daily with LDL down to 23. No reported side effects.  -Continue Crestor 20mg  daily.      Relevant Orders   EKG 12-Lead (Completed)     Other   Gouty arthritis (Chronic)   No recent flares reported while on Allopurinol 300mg  daily. -Continue Allopurinol 300mg  daily.      Hip arthritis   Right hip replacement in July 2024 with good recovery. Now experiencing symptoms in left hip. -Advised that cardiac clearance for left hip replacement, if decided upon within the year, would not be an issue.      Obesity (BMI 30.0-34.9) (Chronic)   Slight increase in weight, possibly due to decreased activity post-hip replacement. -Encouraged to maintain activity levels and healthy diet. The patient understands the need to lose weight with diet and exercise. We have discussed specific strategies for this.      Prediabetes (Chronic)   A1c of 5.8, indicating borderline pre-diabetes. -Advised to monitor diet, particularly intake of starchy foods and sweets.       Follow-up Annual check-up planned for January 2026.  Follow-Up: Return in about 1 year (around 03/09/2024) for 1 Yr Follow-up.  Total time spent: 31 min spent with  patient + 12 min spent charting = 43 min I spent 43 minutes in the care of EMETERIO KLAES today including reviewing labs (2 minutes), reviewing outside labs from [facility] Everywhere (3 minutes), reviewing outside studies (n/a), face to face time discussing treatment options (31 min), 7 min dictating, and documenting in the encounter.     Signed, Marykay Lex, MD, MS Bryan Lemma, M.D., M.S. Interventional Cardiologist  [facility]  Pager # [phone] Phone # [phone] [address] [place], [place] [address]

## 2023-03-11 ENCOUNTER — Encounter: Payer: Self-pay | Admitting: Cardiology

## 2023-03-11 DIAGNOSIS — M1610 Unilateral primary osteoarthritis, unspecified hip: Secondary | ICD-10-CM | POA: Insufficient documentation

## 2023-03-11 DIAGNOSIS — R7303 Prediabetes: Secondary | ICD-10-CM | POA: Insufficient documentation

## 2023-03-11 NOTE — Assessment & Plan Note (Signed)
Excellent response to Crestor 20mg  daily with LDL down to 23. No reported side effects.  -Continue Crestor 20mg  daily.

## 2023-03-11 NOTE — Assessment & Plan Note (Signed)
Well controlled on Benicar 40mg  daily. No symptoms of end-organ damage. -Continue Benicar 40mg  daily.

## 2023-03-11 NOTE — Assessment & Plan Note (Signed)
Trivial CAD noted in In 2021.  Has not had any further send him as of chest pain pressure or dyspnea with rest or exertion.  With evidence of CAD, we will continue to treat cardiac risk factors as noted -> ARB and statin, but with minimal disease no aspirin or other antiplatelet  Did well with right hip surgery-has pending left hip surgery with Dr. Blanchie Dessert.  Unless symptoms change over the next year, no reason for further cardiac evaluation prior to surgery.

## 2023-03-11 NOTE — Assessment & Plan Note (Signed)
A1c of 5.8, indicating borderline pre-diabetes. -Advised to monitor diet, particularly intake of starchy foods and sweets.

## 2023-03-11 NOTE — Assessment & Plan Note (Signed)
Right hip replacement in July 2024 with good recovery. Now experiencing symptoms in left hip. -Advised that cardiac clearance for left hip replacement, if decided upon within the year, would not be an issue.

## 2023-03-11 NOTE — Assessment & Plan Note (Addendum)
Slight increase in weight, possibly due to decreased activity post-hip replacement. -Encouraged to maintain activity levels and healthy diet. The patient understands the need to lose weight with diet and exercise. We have discussed specific strategies for this.

## 2023-03-11 NOTE — Assessment & Plan Note (Signed)
No recent flares reported while on Allopurinol 300mg  daily. -Continue Allopurinol 300mg  daily.

## 2023-05-19 ENCOUNTER — Other Ambulatory Visit: Payer: Self-pay | Admitting: Cardiology

## 2023-07-23 ENCOUNTER — Encounter: Payer: Self-pay | Admitting: Podiatry

## 2023-07-23 ENCOUNTER — Ambulatory Visit (INDEPENDENT_AMBULATORY_CARE_PROVIDER_SITE_OTHER)

## 2023-07-23 ENCOUNTER — Ambulatory Visit (INDEPENDENT_AMBULATORY_CARE_PROVIDER_SITE_OTHER): Admitting: Podiatry

## 2023-07-23 VITALS — Ht 74.0 in | Wt 252.0 lb

## 2023-07-23 DIAGNOSIS — M778 Other enthesopathies, not elsewhere classified: Secondary | ICD-10-CM

## 2023-07-23 DIAGNOSIS — M779 Enthesopathy, unspecified: Secondary | ICD-10-CM

## 2023-07-23 MED ORDER — TRIAMCINOLONE ACETONIDE 10 MG/ML IJ SUSP
10.0000 mg | Freq: Once | INTRAMUSCULAR | Status: AC
Start: 2023-07-23 — End: 2023-07-23
  Administered 2023-07-23: 10 mg via INTRA_ARTICULAR

## 2023-07-23 NOTE — Progress Notes (Signed)
 Subjective:   Patient ID: Hunter Ray, male   DOB: 63 y.o.   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

## 2024-02-05 ENCOUNTER — Encounter: Payer: Self-pay | Admitting: Cardiology
